# Patient Record
Sex: Male | Born: 1985 | Race: Black or African American | Hispanic: No | Smoking: Never smoker
Health system: Southern US, Community
[De-identification: ages and names within clinical notes are randomized; demographics above are authoritative.]

## PROBLEM LIST (undated history)

## (undated) DIAGNOSIS — E119 Type 2 diabetes mellitus without complications: Secondary | ICD-10-CM

## (undated) DIAGNOSIS — I10 Essential (primary) hypertension: Secondary | ICD-10-CM

## (undated) HISTORY — PX: INGUINAL HERNIA REPAIR: SUR1180

## (undated) HISTORY — PX: CHOLECYSTECTOMY: SHX55

## (undated) HISTORY — DX: Type 2 diabetes mellitus without complications: E11.9

---
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
## 2010-02-09 ENCOUNTER — Emergency Department (HOSPITAL_COMMUNITY): Admission: EM | Admit: 2010-02-09 | Discharge: 2010-02-09 | Payer: Self-pay | Admitting: Emergency Medicine

## 2011-07-02 ENCOUNTER — Emergency Department (HOSPITAL_COMMUNITY): Payer: Self-pay

## 2011-07-02 ENCOUNTER — Emergency Department (HOSPITAL_COMMUNITY)
Admission: EM | Admit: 2011-07-02 | Discharge: 2011-07-02 | Disposition: A | Discharge status: Home / Self Care | Payer: Self-pay | Attending: Emergency Medicine | Admitting: Emergency Medicine

## 2011-07-02 DIAGNOSIS — R109 Unspecified abdominal pain: Secondary | ICD-10-CM | POA: Insufficient documentation

## 2011-07-02 DIAGNOSIS — K8020 Calculus of gallbladder without cholecystitis without obstruction: Secondary | ICD-10-CM | POA: Insufficient documentation

## 2011-07-02 LAB — DIFFERENTIAL
Basophils Absolute: 0 10*3/uL (ref 0.0–0.1)
Basophils Relative: 0 % (ref 0–1)
Eosinophils Absolute: 0.1 10*3/uL (ref 0.0–0.7)
Eosinophils Relative: 1 % (ref 0–5)
Lymphocytes Relative: 9 % — ABNORMAL LOW (ref 12–46)
Lymphs Abs: 1.3 10*3/uL (ref 0.7–4.0)
Monocytes Absolute: 0.8 10*3/uL (ref 0.1–1.0)
Monocytes Relative: 5 % (ref 3–12)
Neutro Abs: 12.2 10*3/uL — ABNORMAL HIGH (ref 1.7–7.7)
Neutrophils Relative %: 85 % — ABNORMAL HIGH (ref 43–77)

## 2011-07-02 LAB — COMPREHENSIVE METABOLIC PANEL
ALT: 16 U/L (ref 0–53)
AST: 15 U/L (ref 0–37)
Albumin: 3.4 g/dL — ABNORMAL LOW (ref 3.5–5.2)
Alkaline Phosphatase: 69 U/L (ref 39–117)
BUN: 9 mg/dL (ref 6–23)
CO2: 30 mEq/L (ref 19–32)
Calcium: 9.1 mg/dL (ref 8.4–10.5)
Chloride: 101 mEq/L (ref 96–112)
Creatinine, Ser: 0.85 mg/dL (ref 0.50–1.35)
GFR calc Af Amer: >60 mL/min (ref >60)
GFR calc non Af Amer: >60 mL/min (ref >60)
Glucose, Bld: 100 mg/dL — ABNORMAL HIGH (ref 70–99)
Potassium: 3.7 mEq/L (ref 3.5–5.1)
Sodium: 138 mEq/L (ref 135–145)
Total Bilirubin: 0.5 mg/dL (ref 0.3–1.2)
Total Protein: 7.3 g/dL (ref 6.0–8.3)

## 2011-07-02 LAB — CBC
HCT: 43.4 % (ref 39.0–52.0)
Hemoglobin: 14.8 g/dL (ref 13.0–17.0)
MCH: 27 pg (ref 26.0–34.0)
MCHC: 34.1 g/dL (ref 30.0–36.0)
MCV: 79.1 fL (ref 78.0–100.0)
Platelets: 287 10*3/uL (ref 150–400)
RBC: 5.49 MIL/uL (ref 4.22–5.81)
RDW: 14.3 % (ref 11.5–15.5)
WBC: 14.5 10*3/uL — ABNORMAL HIGH (ref 4.0–10.5)

## 2011-07-02 LAB — OCCULT BLOOD X 1 CARD TO LAB, STOOL: Fecal Occult Bld: NEGATIVE

## 2011-07-02 LAB — LIPASE, BLOOD: Lipase: 23 U/L (ref 11–59)

## 2011-07-12 ENCOUNTER — Ambulatory Visit (INDEPENDENT_AMBULATORY_CARE_PROVIDER_SITE_OTHER): Payer: Self-pay | Admitting: General Surgery

## 2011-07-13 ENCOUNTER — Other Ambulatory Visit (HOSPITAL_COMMUNITY): Payer: Self-pay

## 2011-07-13 ENCOUNTER — Emergency Department (HOSPITAL_COMMUNITY): Admit: 2011-07-13 | Discharge: 2011-07-13 | Disposition: A | Discharge status: Home / Self Care | Payer: Self-pay

## 2011-07-13 ENCOUNTER — Emergency Department (HOSPITAL_COMMUNITY)
Admission: EM | Admit: 2011-07-13 | Discharge: 2011-07-13 | Disposition: A | Discharge status: Home / Self Care | Payer: Self-pay | Attending: Emergency Medicine | Admitting: Emergency Medicine

## 2011-07-13 DIAGNOSIS — R1011 Right upper quadrant pain: Secondary | ICD-10-CM | POA: Insufficient documentation

## 2011-07-13 DIAGNOSIS — K8020 Calculus of gallbladder without cholecystitis without obstruction: Secondary | ICD-10-CM | POA: Insufficient documentation

## 2011-07-13 LAB — CBC
HCT: 43.8 % (ref 39.0–52.0)
Hemoglobin: 14.9 g/dL (ref 13.0–17.0)
MCH: 27.1 pg (ref 26.0–34.0)
MCHC: 34 g/dL (ref 30.0–36.0)
MCV: 79.6 fL (ref 78.0–100.0)
Platelets: 298 10*3/uL (ref 150–400)
RBC: 5.5 MIL/uL (ref 4.22–5.81)
RDW: 14.7 % (ref 11.5–15.5)
WBC: 12.3 10*3/uL — ABNORMAL HIGH (ref 4.0–10.5)

## 2011-07-13 LAB — COMPREHENSIVE METABOLIC PANEL
ALT: 20 U/L (ref 0–53)
AST: 18 U/L (ref 0–37)
Albumin: 3.2 g/dL — ABNORMAL LOW (ref 3.5–5.2)
Alkaline Phosphatase: 75 U/L (ref 39–117)
BUN: 10 mg/dL (ref 6–23)
CO2: 29 mEq/L (ref 19–32)
Calcium: 9.1 mg/dL (ref 8.4–10.5)
Chloride: 102 mEq/L (ref 96–112)
Creatinine, Ser: 0.82 mg/dL (ref 0.50–1.35)
GFR calc Af Amer: >60 mL/min (ref >60)
GFR calc non Af Amer: >60 mL/min (ref >60)
Glucose, Bld: 104 mg/dL — ABNORMAL HIGH (ref 70–99)
Potassium: 3.9 mEq/L (ref 3.5–5.1)
Sodium: 138 mEq/L (ref 135–145)
Total Bilirubin: 0.2 mg/dL — ABNORMAL LOW (ref 0.3–1.2)
Total Protein: 7.1 g/dL (ref 6.0–8.3)

## 2011-07-13 LAB — DIFFERENTIAL
Basophils Absolute: 0.1 10*3/uL (ref 0.0–0.1)
Basophils Relative: 1 % (ref 0–1)
Eosinophils Absolute: 0.2 10*3/uL (ref 0.0–0.7)
Eosinophils Relative: 2 % (ref 0–5)
Lymphocytes Relative: 14 % (ref 12–46)
Lymphs Abs: 1.7 10*3/uL (ref 0.7–4.0)
Monocytes Absolute: 0.7 10*3/uL (ref 0.1–1.0)
Monocytes Relative: 6 % (ref 3–12)
Neutro Abs: 9.6 10*3/uL — ABNORMAL HIGH (ref 1.7–7.7)
Neutrophils Relative %: 78 % — ABNORMAL HIGH (ref 43–77)

## 2011-07-13 LAB — LIPASE, BLOOD: Lipase: 30 U/L (ref 11–59)

## 2011-08-08 ENCOUNTER — Ambulatory Visit (INDEPENDENT_AMBULATORY_CARE_PROVIDER_SITE_OTHER): Payer: Self-pay | Admitting: General Surgery

## 2011-08-14 ENCOUNTER — Encounter (INDEPENDENT_AMBULATORY_CARE_PROVIDER_SITE_OTHER): Payer: Self-pay | Admitting: General Surgery

## 2011-09-17 ENCOUNTER — Encounter: Payer: Self-pay | Admitting: *Deleted

## 2011-09-17 ENCOUNTER — Emergency Department (HOSPITAL_COMMUNITY): Payer: Self-pay

## 2011-09-17 ENCOUNTER — Inpatient Hospital Stay (HOSPITAL_COMMUNITY)
Admission: EM | Admit: 2011-09-17 | Discharge: 2011-09-19 | DRG: 419 | Disposition: A | Discharge status: Home / Self Care | Payer: Self-pay | Source: Ambulatory Visit | Attending: Surgery | Admitting: Surgery

## 2011-09-17 DIAGNOSIS — K8000 Calculus of gallbladder with acute cholecystitis without obstruction: Secondary | ICD-10-CM

## 2011-09-17 DIAGNOSIS — Z23 Encounter for immunization: Secondary | ICD-10-CM

## 2011-09-17 DIAGNOSIS — K8010 Calculus of gallbladder with chronic cholecystitis without obstruction: Secondary | ICD-10-CM | POA: Diagnosis present

## 2011-09-17 DIAGNOSIS — K8020 Calculus of gallbladder without cholecystitis without obstruction: Secondary | ICD-10-CM

## 2011-09-17 LAB — COMPREHENSIVE METABOLIC PANEL
ALT: 28 U/L (ref 0–53)
AST: 21 U/L (ref 0–37)
Albumin: 3.4 g/dL — ABNORMAL LOW (ref 3.5–5.2)
Alkaline Phosphatase: 63 U/L (ref 39–117)
BUN: 13 mg/dL (ref 6–23)
CO2: 30 mEq/L (ref 19–32)
Calcium: 9.1 mg/dL (ref 8.4–10.5)
Chloride: 103 mEq/L (ref 96–112)
Creatinine, Ser: 0.87 mg/dL (ref 0.50–1.35)
GFR calc Af Amer: >90 mL/min (ref >90)
GFR calc non Af Amer: >90 mL/min (ref >90)
Glucose, Bld: 117 mg/dL — ABNORMAL HIGH (ref 70–99)
Potassium: 3.7 mEq/L (ref 3.5–5.1)
Sodium: 141 mEq/L (ref 135–145)
Total Bilirubin: 0.3 mg/dL (ref 0.3–1.2)
Total Protein: 7.6 g/dL (ref 6.0–8.3)

## 2011-09-17 LAB — CBC
HCT: 46.4 % (ref 39.0–52.0)
HCT: 42.8 % (ref 39.0–52.0)
Hemoglobin: 15.5 g/dL (ref 13.0–17.0)
Hemoglobin: 14.8 g/dL (ref 13.0–17.0)
MCH: 26.7 pg (ref 26.0–34.0)
MCH: 27.4 pg (ref 26.0–34.0)
MCHC: 33.4 g/dL (ref 30.0–36.0)
MCHC: 34.6 g/dL (ref 30.0–36.0)
MCV: 79.9 fL (ref 78.0–100.0)
MCV: 79.3 fL (ref 78.0–100.0)
Platelets: 292 10*3/uL (ref 150–400)
Platelets: 263 10*3/uL (ref 150–400)
RBC: 5.81 MIL/uL (ref 4.22–5.81)
RBC: 5.4 MIL/uL (ref 4.22–5.81)
RDW: 14.4 % (ref 11.5–15.5)
RDW: 14.3 % (ref 11.5–15.5)
WBC: 14.6 10*3/uL — ABNORMAL HIGH (ref 4.0–10.5)
WBC: 14.8 10*3/uL — ABNORMAL HIGH (ref 4.0–10.5)

## 2011-09-17 LAB — DIFFERENTIAL
Basophils Absolute: 0 10*3/uL (ref 0.0–0.1)
Basophils Relative: 0 % (ref 0–1)
Eosinophils Absolute: 0 10*3/uL (ref 0.0–0.7)
Eosinophils Relative: 0 % (ref 0–5)
Lymphocytes Relative: 5 % — ABNORMAL LOW (ref 12–46)
Lymphs Abs: 0.8 10*3/uL (ref 0.7–4.0)
Monocytes Absolute: 0.4 10*3/uL (ref 0.1–1.0)
Monocytes Relative: 3 % (ref 3–12)
Neutro Abs: 13.4 10*3/uL — ABNORMAL HIGH (ref 1.7–7.7)
Neutrophils Relative %: 92 % — ABNORMAL HIGH (ref 43–77)

## 2011-09-17 LAB — CREATININE, SERUM
Creatinine, Ser: 0.88 mg/dL (ref 0.50–1.35)
GFR calc Af Amer: >90 mL/min (ref >90)
GFR calc non Af Amer: >90 mL/min (ref >90)

## 2011-09-17 LAB — LIPASE, BLOOD: Lipase: 21 U/L (ref 11–59)

## 2011-09-17 MED ORDER — KCL IN DEXTROSE-NACL 20-5-0.45 MEQ/L-%-% IV SOLN
INTRAVENOUS | Status: DC
  Administered 2011-09-18: 01:00:00 via INTRAVENOUS
  Filled 2011-09-17 (×4): qty 1000

## 2011-09-17 MED ORDER — HYDROMORPHONE HCL PF 1 MG/ML IJ SOLN
0.5000 mg | INTRAMUSCULAR | Status: DC | PRN
  Administered 2011-09-17 – 2011-09-18 (×7): 1 mg via INTRAVENOUS
  Administered 2011-09-18: 0.5 mg via INTRAVENOUS
  Filled 2011-09-17 (×6): qty 1

## 2011-09-17 MED ORDER — KCL IN DEXTROSE-NACL 20-5-0.45 MEQ/L-%-% IV SOLN
INTRAVENOUS | Status: AC
  Filled 2011-09-17: qty 1000

## 2011-09-17 MED ORDER — HYDROMORPHONE HCL PF 1 MG/ML IJ SOLN
1.0000 mg | Freq: Once | INTRAMUSCULAR | Status: AC
  Administered 2011-09-17: 1 mg via INTRAVENOUS
  Filled 2011-09-17: qty 1

## 2011-09-17 MED ORDER — ONDANSETRON HCL 4 MG/2ML IJ SOLN: 4.0000 mg | Freq: Four times a day (QID) | INTRAMUSCULAR | Status: DC | PRN

## 2011-09-17 MED ORDER — CIPROFLOXACIN IN D5W 400 MG/200ML IV SOLN
400.0000 mg | Freq: Two times a day (BID) | INTRAVENOUS | Status: DC
  Administered 2011-09-17 – 2011-09-19 (×5): 400 mg via INTRAVENOUS
  Filled 2011-09-17 (×5): qty 200

## 2011-09-17 MED ORDER — ONDANSETRON HCL 4 MG/2ML IJ SOLN
4.0000 mg | Freq: Once | INTRAMUSCULAR | Status: AC
  Administered 2011-09-17: 4 mg via INTRAVENOUS
  Filled 2011-09-17: qty 2

## 2011-09-17 MED ORDER — HYDROMORPHONE HCL PF 1 MG/ML IJ SOLN
INTRAMUSCULAR | Status: AC
  Administered 2011-09-17: 1 mg via INTRAVENOUS
  Filled 2011-09-17: qty 1

## 2011-09-17 MED ORDER — HEPARIN SODIUM (PORCINE) 5000 UNIT/ML IJ SOLN
5000.0000 [IU] | Freq: Three times a day (TID) | INTRAMUSCULAR | Status: DC
  Administered 2011-09-17 – 2011-09-19 (×3): 5000 [IU] via SUBCUTANEOUS
  Filled 2011-09-17 (×8): qty 1

## 2011-09-17 MED ORDER — ACETAMINOPHEN 650 MG RE SUPP: 650.0000 mg | Freq: Four times a day (QID) | RECTAL | Status: DC | PRN

## 2011-09-17 MED ORDER — ACETAMINOPHEN 325 MG PO TABS: 650.0000 mg | ORAL_TABLET | Freq: Four times a day (QID) | ORAL | Status: DC | PRN

## 2011-09-17 NOTE — H&P (Signed)
[name] is an 25 y.o. male.   Chief Complaint: Abdominal pain nausea and vomiting. HPI: This is a 25 year old African American male has been in good health until September of this year. Back in September he had an episode of abdominal pain nausea and vomiting he was seen in the emergency room and [facility]. He had a diagnosis of cholelithiasis and it was recommended he undergo cholecystectomy at that time. He he was referred to general surgery as an outpatient, but has not had insurance. He has been asymptomatic up until this morning around 7 AM. He awoke this with abdominal pain, nausea, and some mild diarrhea. He presented back to the emergency room at [facility] were abdominal ultrasound shows a 1.9 cm gallstone in the gallbladder neck. The stone does not change with position is small amount of sludge also noted. Gallbladder wall thicknesses 3 mm upper limits of normal the sonographic Murphy's sign was negative. Common bile duct measured 4.2 cm the remainder of the exam was normal. We were called for evaluation for cholecystitis and cholelithiasis. White count in the ER is 14,600. Liver function studies are all within normal limits.   History reviewed. No pertinent past medical history.  History reviewed. No pertinent past surgical history. history of right inguinal repair age 17  History reviewed. No pertinent family history. Social History:  reports that he has never smoked. He does not have any smokeless tobacco history on file. He reports that he does not drink alcohol or use illicit drugs.  Allergies: No Known Allergies  Medications Prior to Admission  Medication Dose Route Frequency Provider Last Rate Last Dose  . HYDROmorphone (DILAUDID) 1 MG/ML injection        1 mg at 09/17/11 1305  . HYDROmorphone (DILAUDID) injection 1 mg  1 mg Intravenous Once Gwyneth Sprout, MD   1 mg at 09/17/11 1137  . ondansetron (ZOFRAN) injection 4 mg  4 mg Intravenous Once Gwyneth Sprout, MD   4 mg at 09/17/11 1137   No current outpatient prescriptions on file as of 09/17/2011.    Results for orders placed during the hospital encounter of 09/17/11 (from the past 48 hour(s))  CBC     Status: Abnormal   Collection Time   09/17/11 11:19 AM      Component Value Range Comment   WBC 14.6 (*) 4.0 - 10.5 (K/uL)    RBC 5.81  4.22 - 5.81 (MIL/uL)    Hemoglobin 15.5  13.0 - 17.0 (g/dL)    HCT 14.7  82.9 - 56.2 (%)    MCV 79.9  78.0 - 100.0 (fL)    MCH 26.7  26.0 - 34.0 (pg)    MCHC 33.4  30.0 - 36.0 (g/dL)    RDW 13.0  86.5 - 78.4 (%)    Platelets 292  150 - 400 (K/uL)   DIFFERENTIAL     Status: Abnormal   Collection Time   09/17/11 11:19 AM      Component Value Range Comment   Neutrophils Relative 92 (*) 43 - 77 (%)    Neutro Abs 13.4 (*) 1.7 - 7.7 (K/uL)    Lymphocytes Relative 5 (*) 12 - 46 (%)    Lymphs Abs 0.8  0.7 - 4.0 (K/uL)    Monocytes Relative 3  3 - 12 (%)    Monocytes Absolute 0.4  0.1 - 1.0 (K/uL)    Eosinophils Relative 0  0 - 5 (%)    Eosinophils Absolute 0.0  0.0 - 0.7 (K/uL)    Basophils Relative 0  0 - 1 (%)    Basophils Absolute 0.0  0.0 - 0.1 (K/uL)   COMPREHENSIVE METABOLIC PANEL     Status: Abnormal   Collection Time   09/17/11 11:19 AM      Component Value Range Comment   Sodium 141  135 - 145 (mEq/L)    Potassium 3.7  3.5 - 5.1 (mEq/L)    Chloride 103  96 - 112 (mEq/L)    CO2 30  19 - 32 (mEq/L)    Glucose, Bld 117 (*) 70 - 99 (mg/dL)    BUN 13  6 - 23 (mg/dL)    Creatinine, Ser 4.09  0.50 - 1.35 (mg/dL)    Calcium 9.1  8.4 - 10.5 (mg/dL)    Total Protein 7.6  6.0 - 8.3 (g/dL)    Albumin 3.4 (*) 3.5 - 5.2 (g/dL)    AST 21  0 - 37 (U/L)    ALT 28  0 - 53 (U/L)    Alkaline Phosphatase 63  39 - 117 (U/L)    Total Bilirubin 0.3  0.3 - 1.2 (mg/dL)    GFR calc non Af Amer >90  >90 (mL/min)    GFR calc Af Amer >90  >90 (mL/min)   LIPASE, BLOOD     Status: Normal   Collection Time   09/17/11 11:19 AM      Component Value Range  Comment   Lipase 21  11 - 59 (U/L)    US Abdomen Complete  09/17/2011  *RADIOLOGY REPORT*  Clinical Data:  Right upper quadrant pain.  Evaluate for cholecystitis.  ABDOMINAL ULTRASOUND COMPLETE  Comparison:  Abdominal ultrasound 07/13/2011 and 07/02/2011  Findings:  Gallbladder:  There is a 1.9 cm gallstone within the gallbladder neck.  This stone does not change position with changes in patient positioning.  Small amount of gallbladder sludge is also noted. Gallbladder wall thickness is upper normal at 3.0 mm.  The sonographic Murphy's sign is negative.  Common Bile Duct:  Measures 4.2 mm, within normal limits in caliber.  Liver: No focal mass lesion identified.  Within normal limits in parenchymal echogenicity.  IVC:  Appears normal.  Pancreas:  No abnormality identified.  Spleen:  Within normal limits in size and echotexture.  Right kidney:  Normal in size and parenchymal echogenicity.  No evidence of mass or hydronephrosis.  Left kidney:  Normal in size and parenchymal echogenicity.  No evidence of mass or hydronephrosis.  Abdominal Aorta:  No aneurysm identified.  IMPRESSION: 1. Cholelithiasis. A 1.9 cm gallstone within the gallbladder neck appears immobile.  This has similar appearances compared to the prior ultrasounds of September 2012.  No definite evidence of acute cholecystitis (gallbladder wall is upper limits of normal and the sonographic Murphy's sign is negative). 2.  Remainder of the abdominal ultrasound is within normal limits.  Original Report Authenticated By: Britta Mccreedy, M.D.    Review of Systems  Constitutional: Negative for fever and chills. Weight loss: since Thanksgiving.  HENT: Negative.   Eyes: Negative.   Respiratory: Negative.   Cardiovascular: Negative.   Gastrointestinal: Positive for nausea (this a.m.), vomiting and abdominal pain. Diarrhea: this a.m. new problem.  Genitourinary: Negative.   Musculoskeletal: Negative.   Skin: Negative.        Multiple tatoos    Neurological: Negative.   Endo/Heme/Allergies: Negative.   Psychiatric/Behavioral: Negative.     Blood pressure 128/80, pulse 82, temperature 98.3 F (36.8 C), temperature  source Oral, resp. rate 20, SpO2 100.00%. Physical Exam  Constitutional: He is oriented to person, place, and time. He appears well-developed and well-nourished.  HENT:  Head: Normocephalic and atraumatic.  Eyes: Conjunctivae and EOM are normal. Pupils are equal, round, and reactive to light.  Neck: Normal range of motion. Neck supple. No JVD present. No tracheal deviation present. No thyromegaly present.  Cardiovascular: Normal rate, regular rhythm, normal heart sounds and intact distal pulses.   Respiratory: Effort normal and breath sounds normal.  GI: Soft. Bowel sounds are normal. There is tenderness (slightly, RUQ).  Musculoskeletal: Normal range of motion.  Lymphadenopathy:    He has no cervical adenopathy.  Neurological: He is alert and oriented to person, place, and time.  Skin: Skin is warm and dry.       Multiple tatoos  Psychiatric: He has a normal mood and affect. His behavior is normal. Judgment and thought content normal.     Assessment/Plan Cholelithiasis with probable cholecystitis. WBC 14.6 Plan: We'll admit the patient place him on IV antibiotics and some clear liquid for tonight. Make him n.p.o. and tentatively schedule for cholecystectomy tomorrow.  09/17/2011, 2:52 PM

## 2011-09-17 NOTE — ED Notes (Signed)
Rt abd pain since 0730 this am states that he has vomitied a lot denies dysuria

## 2011-09-17 NOTE — ED Notes (Signed)
Beverage provided for the patient per nurse/tracey.  Clear beverage.

## 2011-09-17 NOTE — ED Notes (Signed)
Reports being diagnosed with gallstones in past. Having right side abd pain and n/v since this am. No acute distress noted at triage.

## 2011-09-17 NOTE — ED Provider Notes (Signed)
History     CSN: [id number] Arrival date & time: 09/17/2011 10:03 AM   First MD Initiated Contact with Patient 09/17/11 1039      Chief Complaint  Patient presents with  . Abdominal Pain  . Emesis    (Consider location/radiation/quality/duration/timing/severity/associated sxs/prior treatment) Patient is a 25 y.o. male presenting with abdominal pain and vomiting. The history is provided by the patient.  Abdominal Pain The primary symptoms of the illness include abdominal pain, nausea and vomiting. The primary symptoms of the illness do not include fever, shortness of breath or diarrhea. The current episode started 3 to 5 hours ago. The onset of the illness was sudden. The problem has not changed since onset. The abdominal pain is located in the RUQ. The abdominal pain does not radiate. The severity of the abdominal pain is 10/10. The abdominal pain is relieved by nothing. The abdominal pain is exacerbated by vomiting and certain positions.  Vomiting occurs more than 10 times per day. The emesis contains stomach contents.  The patient has not had a change in bowel habit. Additional symptoms associated with the illness include anorexia. Symptoms associated with the illness do not include constipation. Significant associated medical issues include gallstones.  Emesis  Associated symptoms include abdominal pain. Pertinent negatives include no diarrhea and no fever.    History reviewed. No pertinent past medical history.  History reviewed. No pertinent past surgical history.  History reviewed. No pertinent family history.  History  Substance Use Topics  . Smoking status: Never Smoker   . Smokeless tobacco: Not on file  . Alcohol Use: No      Review of Systems  Constitutional: Negative for fever.  Respiratory: Negative for shortness of breath.   Gastrointestinal: Positive for nausea, vomiting, abdominal pain and anorexia. Negative for diarrhea and constipation.  All other  systems reviewed and are negative.    Allergies  Review of patient's allergies indicates no known allergies.  Home Medications  No current outpatient prescriptions on file.  BP 132/81  Pulse 62  Temp(Src) 98.2 F (36.8 C) (Oral)  Resp 18  SpO2 96%  Physical Exam  Nursing note and vitals reviewed. Constitutional: He is oriented to person, place, and time. He appears well-developed and well-nourished. No distress.  HENT:  Head: Normocephalic and atraumatic.  Mouth/Throat: Oropharynx is clear and moist. Mucous membranes are dry.  Eyes: Conjunctivae and EOM are normal. Pupils are equal, round, and reactive to light.  Neck: Normal range of motion. Neck supple.  Cardiovascular: Normal rate, regular rhythm and intact distal pulses.   No murmur heard. Pulmonary/Chest: Effort normal and breath sounds normal. No respiratory distress. He has no wheezes. He has no rales.  Abdominal: Soft. Normal appearance and bowel sounds are normal. He exhibits no distension. There is tenderness in the right upper quadrant. There is guarding and positive Murphy's sign. There is no rebound.  Musculoskeletal: Normal range of motion. He exhibits no edema and no tenderness.  Neurological: He is alert and oriented to person, place, and time.  Skin: Skin is warm and dry. No rash noted. No erythema.  Psychiatric: He has a normal mood and affect. His behavior is normal.    ED Course  Procedures (including critical care time)  Results for orders placed during the hospital encounter of 09/17/11  CBC      Component Value Range   WBC 14.6 (*) 4.0 - 10.5 (K/uL)   RBC 5.81  4.22 - 5.81 (MIL/uL)   Hemoglobin 15.5  13.0 -  17.0 (g/dL)   HCT 16.1  09.6 - 04.5 (%)   MCV 79.9  78.0 - 100.0 (fL)   MCH 26.7  26.0 - 34.0 (pg)   MCHC 33.4  30.0 - 36.0 (g/dL)   RDW 40.9  81.1 - 91.4 (%)   Platelets 292  150 - 400 (K/uL)  DIFFERENTIAL      Component Value Range   Neutrophils Relative 92 (*) 43 - 77 (%)   Neutro Abs  13.4 (*) 1.7 - 7.7 (K/uL)   Lymphocytes Relative 5 (*) 12 - 46 (%)   Lymphs Abs 0.8  0.7 - 4.0 (K/uL)   Monocytes Relative 3  3 - 12 (%)   Monocytes Absolute 0.4  0.1 - 1.0 (K/uL)   Eosinophils Relative 0  0 - 5 (%)   Eosinophils Absolute 0.0  0.0 - 0.7 (K/uL)   Basophils Relative 0  0 - 1 (%)   Basophils Absolute 0.0  0.0 - 0.1 (K/uL)  COMPREHENSIVE METABOLIC PANEL      Component Value Range   Sodium 141  135 - 145 (mEq/L)   Potassium 3.7  3.5 - 5.1 (mEq/L)   Chloride 103  96 - 112 (mEq/L)   CO2 30  19 - 32 (mEq/L)   Glucose, Bld 117 (*) 70 - 99 (mg/dL)   BUN 13  6 - 23 (mg/dL)   Creatinine, Ser 7.82  0.50 - 1.35 (mg/dL)   Calcium 9.1  8.4 - 95.6 (mg/dL)   Total Protein 7.6  6.0 - 8.3 (g/dL)   Albumin 3.4 (*) 3.5 - 5.2 (g/dL)   AST 21  0 - 37 (U/L)   ALT 28  0 - 53 (U/L)   Alkaline Phosphatase 63  39 - 117 (U/L)   Total Bilirubin 0.3  0.3 - 1.2 (mg/dL)   GFR calc non Af Amer >90  >90 (mL/min)   GFR calc Af Amer >90  >90 (mL/min)  LIPASE, BLOOD      Component Value Range   Lipase 21  11 - 59 (U/L)   US Abdomen Complete  09/17/2011  *RADIOLOGY REPORT*  Clinical Data:  Right upper quadrant pain.  Evaluate for cholecystitis.  ABDOMINAL ULTRASOUND COMPLETE  Comparison:  Abdominal ultrasound 07/13/2011 and 07/02/2011  Findings:  Gallbladder:  There is a 1.9 cm gallstone within the gallbladder neck.  This stone does not change position with changes in patient positioning.  Small amount of gallbladder sludge is also noted. Gallbladder wall thickness is upper normal at 3.0 mm.  The sonographic Murphy's sign is negative.  Common Bile Duct:  Measures 4.2 mm, within normal limits in caliber.  Liver: No focal mass lesion identified.  Within normal limits in parenchymal echogenicity.  IVC:  Appears normal.  Pancreas:  No abnormality identified.  Spleen:  Within normal limits in size and echotexture.  Right kidney:  Normal in size and parenchymal echogenicity.  No evidence of mass or  hydronephrosis.  Left kidney:  Normal in size and parenchymal echogenicity.  No evidence of mass or hydronephrosis.  Abdominal Aorta:  No aneurysm identified.  IMPRESSION: 1. Cholelithiasis. A 1.9 cm gallstone within the gallbladder neck appears immobile.  This has similar appearances compared to the prior ultrasounds of September 2012.  No definite evidence of acute cholecystitis (gallbladder wall is upper limits of normal and the sonographic Murphy's sign is negative). 2.  Remainder of the abdominal ultrasound is within normal limits.  Original Report Authenticated By: Britta Mccreedy, M.D.      No  diagnosis found.    MDM   Pt with abd pain consistent with gallstones with hx of gallstones in the past dx about 3 weeks ago but does not have insurance and has not made appt for removal.  Pt is vomiting and RUQ pain when he woke up this am but has not eaten.  No other medical hx and no other sx. Will check CBC, CMP, lipase and [place] to further eval for cholecystitis.   1:25 PM Pt with leukocytosis of 14,000 and CMP/lipase wnl.  U/S with 1.60mm stone at the neck of the gallbladder and wall thickness at the upper limits of normal.  However on repeat eval still having persistent pain. Will discuss with surgery.     Gwyneth Sprout, MD 09/17/11 1326

## 2011-09-18 ENCOUNTER — Inpatient Hospital Stay (HOSPITAL_COMMUNITY): Payer: Self-pay

## 2011-09-18 ENCOUNTER — Encounter (HOSPITAL_COMMUNITY): Payer: Self-pay

## 2011-09-18 ENCOUNTER — Encounter (HOSPITAL_COMMUNITY): Payer: Self-pay | Admitting: Anesthesiology

## 2011-09-18 ENCOUNTER — Inpatient Hospital Stay (HOSPITAL_COMMUNITY): Payer: Self-pay | Admitting: Anesthesiology

## 2011-09-18 ENCOUNTER — Encounter (HOSPITAL_COMMUNITY): Admission: EM | Disposition: A | Discharge status: Home / Self Care | Payer: Self-pay | Source: Ambulatory Visit

## 2011-09-18 ENCOUNTER — Other Ambulatory Visit (INDEPENDENT_AMBULATORY_CARE_PROVIDER_SITE_OTHER): Payer: Self-pay | Admitting: Surgery

## 2011-09-18 DIAGNOSIS — K8000 Calculus of gallbladder with acute cholecystitis without obstruction: Secondary | ICD-10-CM

## 2011-09-18 HISTORY — PX: CHOLECYSTECTOMY: SHX55

## 2011-09-18 LAB — CBC
HCT: 42.7 % (ref 39.0–52.0)
Hemoglobin: 14.4 g/dL (ref 13.0–17.0)
MCH: 26.8 pg (ref 26.0–34.0)
MCHC: 33.7 g/dL (ref 30.0–36.0)
MCV: 79.4 fL (ref 78.0–100.0)
Platelets: 260 10*3/uL (ref 150–400)
RBC: 5.38 MIL/uL (ref 4.22–5.81)
RDW: 14.3 % (ref 11.5–15.5)
WBC: 13.7 10*3/uL — ABNORMAL HIGH (ref 4.0–10.5)

## 2011-09-18 LAB — COMPREHENSIVE METABOLIC PANEL
ALT: 65 U/L — ABNORMAL HIGH (ref 0–53)
AST: 51 U/L — ABNORMAL HIGH (ref 0–37)
Albumin: 3 g/dL — ABNORMAL LOW (ref 3.5–5.2)
Alkaline Phosphatase: 64 U/L (ref 39–117)
BUN: 10 mg/dL (ref 6–23)
CO2: 28 mEq/L (ref 19–32)
Calcium: 8.5 mg/dL (ref 8.4–10.5)
Chloride: 103 mEq/L (ref 96–112)
Creatinine, Ser: 0.94 mg/dL (ref 0.50–1.35)
GFR calc Af Amer: >90 mL/min (ref >90)
GFR calc non Af Amer: >90 mL/min (ref >90)
Glucose, Bld: 102 mg/dL — ABNORMAL HIGH (ref 70–99)
Potassium: 3.8 mEq/L (ref 3.5–5.1)
Sodium: 138 mEq/L (ref 135–145)
Total Bilirubin: 0.8 mg/dL (ref 0.3–1.2)
Total Protein: 6.8 g/dL (ref 6.0–8.3)

## 2011-09-18 LAB — LIPASE, BLOOD: Lipase: 23 U/L (ref 11–59)

## 2011-09-18 LAB — SURGICAL PCR SCREEN
MRSA, PCR: NEGATIVE
Staphylococcus aureus: NEGATIVE

## 2011-09-18 SURGERY — LAPAROSCOPIC CHOLECYSTECTOMY WITH INTRAOPERATIVE CHOLANGIOGRAM
Anesthesia: General | Site: Abdomen | Wound class: Contaminated

## 2011-09-18 MED ORDER — SODIUM CHLORIDE 0.9 % IR SOLN
Status: DC | PRN
  Administered 2011-09-18: 1000 mL

## 2011-09-18 MED ORDER — HYDROCODONE-ACETAMINOPHEN 5-325 MG PO TABS
1.0000 | ORAL_TABLET | ORAL | Status: DC | PRN
  Administered 2011-09-18 – 2011-09-19 (×3): 2 via ORAL
  Filled 2011-09-18 (×3): qty 2

## 2011-09-18 MED ORDER — BUPIVACAINE HCL (PF) 0.25 % IJ SOLN
INTRAMUSCULAR | Status: DC | PRN
  Administered 2011-09-18: 30 mL

## 2011-09-18 MED ORDER — INFLUENZA VIRUS VACC SPLIT PF IM SUSP
0.5000 mL | INTRAMUSCULAR | Status: AC
  Administered 2011-09-19: 0.5 mL via INTRAMUSCULAR
  Filled 2011-09-18: qty 0.5

## 2011-09-18 MED ORDER — FENTANYL CITRATE 0.05 MG/ML IJ SOLN
INTRAMUSCULAR | Status: DC | PRN
  Administered 2011-09-18 (×2): 50 ug via INTRAVENOUS
  Administered 2011-09-18: 150 ug via INTRAVENOUS

## 2011-09-18 MED ORDER — IOHEXOL 300 MG/ML  SOLN
INTRAMUSCULAR | Status: DC | PRN
  Administered 2011-09-18: 13 mL

## 2011-09-18 MED ORDER — BIOTENE DRY MOUTH MT LIQD: 15.0000 mL | Freq: Two times a day (BID) | OROMUCOSAL | Status: DC

## 2011-09-18 MED ORDER — KCL IN DEXTROSE-NACL 20-5-0.45 MEQ/L-%-% IV SOLN
INTRAVENOUS | Status: DC
  Administered 2011-09-18 – 2011-09-19 (×2): via INTRAVENOUS
  Filled 2011-09-18 (×5): qty 1000

## 2011-09-18 MED ORDER — ONDANSETRON HCL 4 MG/2ML IJ SOLN: 4.0000 mg | Freq: Four times a day (QID) | INTRAMUSCULAR | Status: DC | PRN

## 2011-09-18 MED ORDER — MORPHINE SULFATE 2 MG/ML IJ SOLN
1.0000 mg | INTRAMUSCULAR | Status: DC | PRN
  Administered 2011-09-18 (×2): 2 mg via INTRAVENOUS
  Filled 2011-09-18: qty 1

## 2011-09-18 MED ORDER — HEPARIN SODIUM (PORCINE) 5000 UNIT/ML IJ SOLN
5000.0000 [IU] | Freq: Three times a day (TID) | INTRAMUSCULAR | Status: DC
  Filled 2011-09-18: qty 1

## 2011-09-18 MED ORDER — ROCURONIUM BROMIDE 100 MG/10ML IV SOLN
INTRAVENOUS | Status: DC | PRN
  Administered 2011-09-18: 80 mg via INTRAVENOUS

## 2011-09-18 MED ORDER — ONDANSETRON HCL 4 MG PO TABS: 4.0000 mg | ORAL_TABLET | Freq: Four times a day (QID) | ORAL | Status: DC | PRN

## 2011-09-18 MED ORDER — CHLORHEXIDINE GLUCONATE 0.12 % MT SOLN
15.0000 mL | Freq: Two times a day (BID) | OROMUCOSAL | Status: DC
  Administered 2011-09-18 (×2): 15 mL via OROMUCOSAL
  Filled 2011-09-18 (×2): qty 15

## 2011-09-18 MED ORDER — MORPHINE SULFATE 2 MG/ML IJ SOLN
INTRAMUSCULAR | Status: AC
  Administered 2011-09-18: 2 mg via INTRAVENOUS
  Filled 2011-09-18: qty 1

## 2011-09-18 MED ORDER — PROPOFOL 10 MG/ML IV EMUL
INTRAVENOUS | Status: DC | PRN
  Administered 2011-09-18: 200 mg via INTRAVENOUS

## 2011-09-18 MED ORDER — ONDANSETRON HCL 4 MG/2ML IJ SOLN
INTRAMUSCULAR | Status: DC | PRN
  Administered 2011-09-18: 4 mg via INTRAVENOUS

## 2011-09-18 MED ORDER — LIDOCAINE HCL (CARDIAC) 20 MG/ML IV SOLN
INTRAVENOUS | Status: DC | PRN
  Administered 2011-09-18: 100 mg via INTRAVENOUS

## 2011-09-18 MED ORDER — LACTATED RINGERS IV SOLN
INTRAVENOUS | Status: DC | PRN
  Administered 2011-09-18 (×2): via INTRAVENOUS

## 2011-09-18 SURGICAL SUPPLY — 59 items
ADH SKN CLS APL DERMABOND .7 (GAUZE/BANDAGES/DRESSINGS) ×1
ADH SKN CLS LQ APL DERMABOND (GAUZE/BANDAGES/DRESSINGS) ×1
APPLIER CLIP ROT 10 11.4 M/L (STAPLE) ×2
APR CLP MED LRG 11.4X10 (STAPLE) ×1
BAG SPEC RTRVL LRG 6X4 10 (ENDOMECHANICALS) ×1
BLADE SURG ROTATE 9660 (MISCELLANEOUS) IMPLANT
CANISTER SUCTION 2500CC (MISCELLANEOUS) ×2 IMPLANT
CHLORAPREP W/TINT 10.5 ML (MISCELLANEOUS) ×1 IMPLANT
CHLORAPREP W/TINT 26ML (MISCELLANEOUS) ×2 IMPLANT
CHOLANGIOGRAM CATH TAUT (CATHETERS) ×2 IMPLANT
CLIP APPLIE ROT 10 11.4 M/L (STAPLE) ×1 IMPLANT
CLOTH BEACON ORANGE TIMEOUT ST (SAFETY) ×2 IMPLANT
COVER MAYO STAND STRL (DRAPES) ×2 IMPLANT
COVER SURGICAL LIGHT HANDLE (MISCELLANEOUS) ×2 IMPLANT
DECANTER SPIKE VIAL GLASS SM (MISCELLANEOUS) ×3 IMPLANT
DERMABOND ADHESIVE PROPEN (GAUZE/BANDAGES/DRESSINGS) ×1
DERMABOND ADVANCED (GAUZE/BANDAGES/DRESSINGS) ×1
DERMABOND ADVANCED .7 DNX12 (GAUZE/BANDAGES/DRESSINGS) ×1 IMPLANT
DERMABOND ADVANCED .7 DNX6 (GAUZE/BANDAGES/DRESSINGS) IMPLANT
DRAPE C-ARM 42X72 X-RAY (DRAPES) ×2 IMPLANT
ELECT REM PT RETURN 9FT ADLT (ELECTROSURGICAL) ×2
ELECTRODE REM PT RTRN 9FT ADLT (ELECTROSURGICAL) ×1 IMPLANT
FILTER SMOKE EVAC LAPAROSHD (FILTER) ×2 IMPLANT
GAUZE SPONGE 2X2 8PLY STRL LF (GAUZE/BANDAGES/DRESSINGS) ×1 IMPLANT
GLOVE BIO SURGEON STRL SZ8 (GLOVE) ×1 IMPLANT
GLOVE BIOGEL PI IND STRL 7.0 (GLOVE) IMPLANT
GLOVE BIOGEL PI IND STRL 8 (GLOVE) IMPLANT
GLOVE BIOGEL PI INDICATOR 7.0 (GLOVE) ×3
GLOVE BIOGEL PI INDICATOR 8 (GLOVE) ×1
GLOVE ECLIPSE 6.5 STRL STRAW (GLOVE) ×4 IMPLANT
GLOVE SS BIOGEL STRL SZ 7 (GLOVE) IMPLANT
GLOVE SUPERSENSE BIOGEL SZ 7 (GLOVE) ×1
GLOVE SURG SIGNA 7.5 PF LTX (GLOVE) ×2 IMPLANT
GLOVE SURG SS PI 6.5 STRL IVOR (GLOVE) ×1 IMPLANT
GLOVE SURG SS PI 7.5 STRL IVOR (GLOVE) ×2 IMPLANT
GOWN PREVENTION PLUS XLARGE (GOWN DISPOSABLE) ×2 IMPLANT
GOWN STRL NON-REIN LRG LVL3 (GOWN DISPOSABLE) ×4 IMPLANT
GOWN STRL REIN XL XLG (GOWN DISPOSABLE) ×2 IMPLANT
IV CATH 14GX2 1/4 (CATHETERS) ×2 IMPLANT
KIT BASIN OR (CUSTOM PROCEDURE TRAY) ×2 IMPLANT
KIT ROOM TURNOVER OR (KITS) ×2 IMPLANT
NS IRRIG 1000ML POUR BTL (IV SOLUTION) ×2 IMPLANT
PAD ARMBOARD 7.5X6 YLW CONV (MISCELLANEOUS) ×2 IMPLANT
POUCH SPECIMEN RETRIEVAL 10MM (ENDOMECHANICALS) ×2 IMPLANT
SCISSORS LAP 5X35 DISP (ENDOMECHANICALS) IMPLANT
SET IRRIG TUBING LAPAROSCOPIC (IRRIGATION / IRRIGATOR) ×3 IMPLANT
SLEEVE Z-THREAD 5X100MM (TROCAR) ×2 IMPLANT
SPECIMEN JAR SMALL (MISCELLANEOUS) ×2 IMPLANT
SPONGE GAUZE 2X2 STER 10/PKG (GAUZE/BANDAGES/DRESSINGS)
STOPCOCK 4 WAY LG BORE MALE ST (IV SETS) ×2 IMPLANT
SUT VIC AB 5-0 PS2 18 (SUTURE) ×2 IMPLANT
TOWEL OR 17X24 6PK STRL BLUE (TOWEL DISPOSABLE) ×2 IMPLANT
TOWEL OR 17X26 10 PK STRL BLUE (TOWEL DISPOSABLE) ×2 IMPLANT
TRAY LAPAROSCOPIC (CUSTOM PROCEDURE TRAY) ×2 IMPLANT
TROCAR XCEL BLUNT TIP 100MML (ENDOMECHANICALS) ×2 IMPLANT
TROCAR Z-THREAD FIOS 11X100 BL (TROCAR) ×2 IMPLANT
TROCAR Z-THREAD FIOS 5X100MM (TROCAR) ×2 IMPLANT
TUBING EXTENTION W/L.L. (IV SETS) ×2 IMPLANT
WATER STERILE IRR 1000ML POUR (IV SOLUTION) IMPLANT

## 2011-09-18 NOTE — Op Note (Signed)
[name], [name]                ACCOUNT NO.:  [id number]  MEDICAL RECORD NO.:  [id number]  LOCATION:  5148                         FACILITY:  [facility]  PHYSICIAN:  Sandria Bales. Ezzard Standing, M.D.  DATE OF BIRTH:  06-Sep-1986  DATE OF PROCEDURE: DATE OF DISCHARGE:                              OPERATIVE REPORT   This is an unedited report.  For final edited dictation, please contact [facility]/Liz Katrinka Blazing.  PREOPERATIVE DIAGNOSIS:  Acute cholecystitis, cholelithiasis.  POSTOPERATIVE DIAGNOSIS:  Edematous cholecystitis, cholelithiasis.  PROCEDURES:  Laparoscopic cholecystectomy with intraoperative cholangiogram.  SURGEON:  Sandria Bales. Ezzard Standing, MD  FIRST ASSISTANT:  Lodema Pilot, MD  ANESTHESIA:  General endotracheal.  ESTIMATED BLOOD LOSS:  Minimal.  INDICATION FOR PROCEDURE:  Mr. [name] is a 25 year old African American male who presented to the [facility] Emergency Room with acute cholecystitis.  I discussed with him about proceeding with a laparoscopic cholecystectomy.  I discussed the indications, potential complications of surgery.  The potential complications include, but are not limited to, bleeding, infection, common bile duct injury, and open gallbladder surgery.  OPERATIVE NOTE:  The patient was placed in a supine position, given a general endotracheal anesthetic, was supervised by Dr. Adonis Huguenin, in room #17.  Time-out was held and surgical checklist run.  Abdomen was prepped with ChloraPrep and sterilely draped.  An infraumbilical incision with sharp dissection carried down to the abdominal cavity.  A 0-degree, 10-mm laparoscope was inserted through a 12-mm Hasson trocar and Hasson trocar was secured with a 0 Vicryl suture.  I placed a 10-mm trocar in the subxiphoid location.  I placed a 5-mm trocar in the right mid subcostal and a 5-mm trocar in the right lateral subcostal position.  I switched the scope to an angled scope.  Abdominal exploration revealed right  and left lobes of liver were unremarkable.  The stomach was unremarkable.  The bowel that I could see was unremarkable.  I then turned my attention to the gallbladder, was acutely edematous and distended.  It appeared he had a large stone about 1.5-2 cm impacted in the neck of the gallbladder.  I decompressed the gallbladder.  I then dissected out the cystic duct and cystic artery.  I placed 3 clips across the cystic artery.  I placed a clip on the gallbladder side of the cystic duct and shot intraoperative cholangiogram.  The intraoperative cholangiogram was shot using a cutoff Taut.  Taut catheter was inserted through a 16-gauge Jelco and placed at the side of the cut cystic duct.  This was secured with an Endoclip.  Under fluoroscopy, I injected about 12 mL of half-strength Hypaque solution that showed free flow of contrast down the cystic duct into the common bile duct up the hepatic radicles into the duodenum.  There was no filling defect or mass.  This was felt to be a normal intraoperative cholangiogram.  The Taut catheter was then removed.  The cystic duct triply endoclipped and divided.  The posterior branch of the cystic artery was identified, clipped and divided.  The gallbladder was then sharply and bluntly dissected from the gallbladder bed.  As the gallbladder bed got up towards the dome of the  gallbladder, were about 2/3rd of the gallbladder, was actually intrahepatic and had to kind of get this out.  The gallbladder was then removed, placed in EndoCatch bag.  I revisualized the triangle of Calot.  I revisualized the gallbladder bed, saw no bleeding or bile leak.  The gallbladder was then removed through the umbilicus, sent to Pathology for permanent pathology.  The sponge and needle count were correct.  The ports were removed in turn.  The umbilical port was closed with 0 Vicryl suture.  The skin at each port was closed with 5-0 Monocryl suture, painted with  Dermabond and sterilely dressed.  The patient was then transferred to the recovery room in good condition.  Sponge and needle counts were correct at the end of the case.     Sandria Bales. Ezzard Standing, M.D.     DHN/MEDQ  D:  09/18/2011  T:  09/18/2011  Job:  [id number]

## 2011-09-18 NOTE — Progress Notes (Signed)
HD #1 No PCP  Assessment/Plan:   1. Cholecystitis with cholelithiasis (09/17/2011)  Probable impacted gall stone.  I discussed with the patient the indications and risks of gall bladder surgery.  The primary risks of gall bladder surgery include, but are not limited to, bleeding, infection, common bile duct injury, and open surgery.  There is also the risk that the patient may have continued symptoms after surgery. I tried to answer the patient's questions.    LOS: 1 day   Subjective:  Still sore.  His brother had gall bladder disease (but he is a sickle cell pt.)  Objective:   Filed Vitals:   09/18/11 0520  BP: 126/66  Pulse: 83  Temp: 98.9 F (37.2 C)  Resp: 16     Intake/Output from previous day:  11/26 0701 - 11/27 0700 In: 800 [I.V.:800] Out: 750 [Urine:750]  Intake/Output this shift:      Physical Exam:   General: Large AA male who is alert and generally healthy appearing.    HEENT: Normal. Pupils equal. Good dentition. .   Lungs: Clear    Abdomen: Mild RUQ soreness   Neurologic:  Grossly intact to motor and sensory function.   Psychiatric: Has normal mood and affect. Behavior is normal   Lab Results:    Basename 09/18/11 0505 09/17/11 2257  WBC 13.7* 14.8*  HGB 14.4 14.8  HCT 42.7 42.8  PLT 260 263    BMET   Basename 09/18/11 0505 09/17/11 2257 09/17/11 1119  NA 138 -- 141  K 3.8 -- 3.7  CL 103 -- 103  CO2 28 -- 30  GLUCOSE 102* -- 117*  BUN 10 -- 13  CREATININE 0.94 0.88 --  CALCIUM 8.5 -- 9.1    PT/INR  No results found for this basename: LABPROT:2,INR:2 in the last 72 hours  ABG  No results found for this basename: PHART:2,PCO2:2,PO2:2,HCO3:2 in the last 72 hours   Studies/Results:  US Abdomen Complete  09/17/2011  *RADIOLOGY REPORT*  Clinical Data:  Right upper quadrant pain.  Evaluate for cholecystitis.  ABDOMINAL ULTRASOUND COMPLETE  Comparison:  Abdominal ultrasound 07/13/2011 and 07/02/2011  Findings:  Gallbladder:  There is a 1.9 cm  gallstone within the gallbladder neck.  This stone does not change position with changes in patient positioning.  Small amount of gallbladder sludge is also noted. Gallbladder wall thickness is upper normal at 3.0 mm.  The sonographic Murphy's sign is negative.  Common Bile Duct:  Measures 4.2 mm, within normal limits in caliber.  Liver: No focal mass lesion identified.  Within normal limits in parenchymal echogenicity.  IVC:  Appears normal.  Pancreas:  No abnormality identified.  Spleen:  Within normal limits in size and echotexture.  Right kidney:  Normal in size and parenchymal echogenicity.  No evidence of mass or hydronephrosis.  Left kidney:  Normal in size and parenchymal echogenicity.  No evidence of mass or hydronephrosis.  Abdominal Aorta:  No aneurysm identified.  IMPRESSION: 1. Cholelithiasis. A 1.9 cm gallstone within the gallbladder neck appears immobile.  This has similar appearances compared to the prior ultrasounds of September 2012.  No definite evidence of acute cholecystitis (gallbladder wall is upper limits of normal and the sonographic Murphy's sign is negative). 2.  Remainder of the abdominal ultrasound is within normal limits.  Original Report Authenticated By: Britta Mccreedy, M.D.     Anti-infectives:   Anti-infectives     Start     Dose/Rate Route Frequency Ordered Stop   09/17/11 1730  ciprofloxacin (CIPRO) IVPB 400 mg        400 mg 200 mL/hr over 60 Minutes Intravenous Every 12 hours 09/17/11 1715             Ovidio Kin, MD, FACS Pager: [phone],   [facility] Surgery Office: [phone] 09/18/2011

## 2011-09-18 NOTE — Progress Notes (Signed)
Patient has been npo since midnight, Yvone Neu, RN

## 2011-09-18 NOTE — Preoperative (Signed)
Beta Blockers   Reason not to administer Beta Blockers:Not Applicable 

## 2011-09-18 NOTE — Anesthesia Postprocedure Evaluation (Signed)
Anesthesia Post Note  Patient: [name]  Procedure(s) Performed:  LAPAROSCOPIC CHOLECYSTECTOMY WITH INTRAOPERATIVE CHOLANGIOGRAM  Anesthesia type: General  Patient location: PACU  Post pain: Pain level controlled  Post assessment: Patient's Cardiovascular Status Stable  Last Vitals:  Filed Vitals:   09/18/11 1315  BP: 134/74  Pulse: 96  Temp: 37 C  Resp: 18    Post vital signs: Reviewed and stable  Level of consciousness: sedated  Complications: No apparent anesthesia complications

## 2011-09-18 NOTE — Transfer of Care (Signed)
Immediate Anesthesia Transfer of Care Note  Patient: [name]  Procedure(s) Performed:  LAPAROSCOPIC CHOLECYSTECTOMY WITH INTRAOPERATIVE CHOLANGIOGRAM  Patient Location: PACU  Anesthesia Type: General  Level of Consciousness: awake, alert , oriented and patient cooperative  Airway & Oxygen Therapy: Patient Spontanous Breathing and Patient connected to nasal cannula oxygen  Post-op Assessment: Report given to PACU RN, Post -op Vital signs reviewed and stable and Patient moving all extremities  Post vital signs: Reviewed and stable  Complications: No apparent anesthesia complications

## 2011-09-18 NOTE — Brief Op Note (Signed)
09/17/2011 - 09/18/2011  4:03 PM  PATIENT:  [name], 25 y.o., male, MRN: [id number]  PREOP DIAGNOSIS:  GALLSTONES, cholecystitis   POSTOP DIAGNOSIS:   Edematous cholecystitis, cholelithiasis  PROCEDURE:   Procedure(s): LAPAROSCOPIC CHOLECYSTECTOMY WITH INTRAOPERATIVE CHOLANGIOGRAM  SURGEON:   Ovidio Kin, M.D.  ASSISTANTTrude Mcburney, MD  ANESTHESIA:   general  EBL:  min  ml  Total I/O In: 1300 [I.V.:1300] Out: 1350 [Urine:1300; Blood:50]  BLOOD ADMINISTERED: none  DRAINS: none   LOCAL MEDICATIONS USED:   20 cc 1/4% marcaine  SPECIMEN:   Gall bladder  COUNTS CORRECT:  YES  INDICATIONS FOR PROCEDURE:  [name] is a 25 y.o. (DOB: Jul 07, 1986) AA male whose primary care physician is No primary provider on file. and comes for cholecystectomy.   The indications and risks of the surgery were explained to the patient.  The risks include, but are not limited to, infection, bleeding, and nerve injury.  Note dictated to:   #[id number]  DN  09/18/2011

## 2011-09-18 NOTE — Anesthesia Procedure Notes (Signed)
Procedure Name: Intubation Date/Time: 09/18/2011 2:30 PM Performed by: Tyrone Nine Pre-anesthesia Checklist: Patient identified, Emergency Drugs available, Suction available and Patient being monitored Patient Re-evaluated:Patient Re-evaluated prior to inductionOxygen Delivery Method: Circle System Utilized Preoxygenation: Pre-oxygenation with 100% oxygen Intubation Type: IV induction Ventilation: Mask ventilation without difficulty Laryngoscope Size: Mac and 3 Grade View: Grade I Tube type: Oral Tube size: 7.5 mm Number of attempts: 1 Airway Equipment and Method: patient positioned with wedge pillow and stylet Placement Confirmation: ETT inserted through vocal cords under direct vision and positive ETCO2 Secured at: 23 cm Tube secured with: Tape Dental Injury: Teeth and Oropharynx as per pre-operative assessment

## 2011-09-18 NOTE — Anesthesia Preprocedure Evaluation (Addendum)
Anesthesia Evaluation  Patient identified by MRN, date of birth, ID band Patient awake    Reviewed: Allergy & Precautions, H&P , NPO status , Patient's Chart, lab work & pertinent test results  Airway Mallampati: I TM Distance: >3 FB   Mouth opening: Limited Mouth Opening  Dental No notable dental hx. (+) Teeth Intact   Pulmonary neg pulmonary ROS,  clear to auscultation        Cardiovascular neg cardio ROS Regular Normal    Neuro/Psych Negative Neurological ROS  Negative Psych ROS   GI/Hepatic Neg liver ROS, No vomiting today.   Endo/Other  Negative Endocrine ROS  Renal/GU negative Renal ROS  Genitourinary negative   Musculoskeletal negative musculoskeletal ROS (+)   Abdominal Normal abdominal exam  (+)   Peds negative pediatric ROS (+)  Hematology negative hematology ROS (+)   Anesthesia Other Findings   Reproductive/Obstetrics negative OB ROS                           Anesthesia Physical Anesthesia Plan  ASA: I  Anesthesia Plan: General   Post-op Pain Management:    Induction: Intravenous  Airway Management Planned: Oral ETT  Additional Equipment:   Intra-op Plan:   Post-operative Plan: Extubation in OR  Informed Consent: I have reviewed the patients History and Physical, chart, labs and discussed the procedure including the risks, benefits and alternatives for the proposed anesthesia with the patient or authorized representative who has indicated his/her understanding and acceptance.   Dental advisory given  Plan Discussed with: CRNA and Anesthesiologist  Anesthesia Plan Comments:         Anesthesia Quick Evaluation

## 2011-09-19 LAB — DIFFERENTIAL
Basophils Absolute: 0.1 10*3/uL (ref 0.0–0.1)
Basophils Relative: 0 % (ref 0–1)
Eosinophils Absolute: 0.2 10*3/uL (ref 0.0–0.7)
Eosinophils Relative: 2 % (ref 0–5)
Lymphocytes Relative: 19 % (ref 12–46)
Lymphs Abs: 2.2 10*3/uL (ref 0.7–4.0)
Monocytes Absolute: 1.2 10*3/uL — ABNORMAL HIGH (ref 0.1–1.0)
Monocytes Relative: 10 % (ref 3–12)
Neutro Abs: 7.8 10*3/uL — ABNORMAL HIGH (ref 1.7–7.7)
Neutrophils Relative %: 68 % (ref 43–77)

## 2011-09-19 LAB — CBC
HCT: 42.1 % (ref 39.0–52.0)
Hemoglobin: 14.3 g/dL (ref 13.0–17.0)
MCH: 27 pg (ref 26.0–34.0)
MCHC: 34 g/dL (ref 30.0–36.0)
MCV: 79.4 fL (ref 78.0–100.0)
Platelets: 253 10*3/uL (ref 150–400)
RBC: 5.3 MIL/uL (ref 4.22–5.81)
RDW: 14.2 % (ref 11.5–15.5)
WBC: 11.4 10*3/uL — ABNORMAL HIGH (ref 4.0–10.5)

## 2011-09-19 LAB — BASIC METABOLIC PANEL
BUN: 10 mg/dL (ref 6–23)
CO2: 29 mEq/L (ref 19–32)
Calcium: 8.4 mg/dL (ref 8.4–10.5)
Chloride: 104 mEq/L (ref 96–112)
Creatinine, Ser: 0.92 mg/dL (ref 0.50–1.35)
GFR calc Af Amer: >90 mL/min (ref >90)
GFR calc non Af Amer: >90 mL/min (ref >90)
Glucose, Bld: 101 mg/dL — ABNORMAL HIGH (ref 70–99)
Potassium: 3.9 mEq/L (ref 3.5–5.1)
Sodium: 140 mEq/L (ref 135–145)

## 2011-09-19 MED ORDER — HYDROCODONE-ACETAMINOPHEN 5-325 MG PO TABS: 1.0000 | ORAL_TABLET | ORAL | Status: AC | PRN

## 2011-09-19 MED ORDER — ACETAMINOPHEN 325 MG PO TABS: 650.0000 mg | ORAL_TABLET | Freq: Four times a day (QID) | ORAL | Status: AC | PRN

## 2011-09-19 NOTE — Discharge Summary (Signed)
Physician Discharge Summary  Patient ID: [name] MRN: [id number] DOB/AGE: September 19, 1986 25 y.o.  Admit date: 09/17/2011 Discharge date: 09/19/2011  Admission Diagnoses: Acute cholecystitis, cholelithiasis  Discharge Diagnoses: Edematous cholecystitis with cholelithiasis Active Problems:  Cholecystitis with cholelithiasis   PROCEDURES: Laparoscopic cholecystectomy with intraoperative cholangiogram 09/18/2011 Dr. Ezzard Standing.  Hospital Course: Patient is a 25 year old African American male who presented with abdominal pain nausea and vomiting the emergency room. He was found to have this problem in September of this year. He was seen in the ER [facility] referred for general surgery as an outpatient. He was unable to follow a returns to the emergency room with recurrent abdominal pain. Ultrasound showed a stone which did not move sludge and gallbladder wall thickness up to 3 mm. He was admitted for elective cholecystectomy, and antibiotics. White count admission was 14,600 . He was placed on IV antibiotics, and underwent laparoscopic cholecystectomy 09/18/2011. Postoperatively he is doing well . He is hemodynamically stable, voiding, and eating a regular diet. His incisions looked good and we plan to discharge him home today.  Followup will be in 2 weeks Dr. Allene Pyo office. Discharge meds will include plain Tylenol and hydrocodone/APAP 5/325 every 4 hours when necessary as needed. Condition on discharge: Improved. [name]         Disposition: Home or Self Care   Current Discharge Medication List    START taking these medications   Details  acetaminophen (TYLENOL) 325 MG tablet Take 2 tablets (650 mg total) by mouth every 6 (six) hours as needed (or Temp > 100). Qty: 30 tablet, Refills: 0    HYDROcodone-acetaminophen (NORCO) 5-325 MG per tablet Take 1-2 tablets by mouth every 4 (four) hours as needed. Qty: 40 tablet,  Refills: 0       Follow-up Information    Follow up with NEWMAN,DAVID H, MD. Make an appointment in 2 weeks. (Call if you have problems)    Contact information:   [facility] [address], [address] [place] [address] [phone]          Signed: Sherrie George 09/19/2011, 11:03 AM

## 2011-09-19 NOTE — Progress Notes (Signed)
1 Day Post-Op  Subjective: He's a bit sore, Had regular breakfast. Ambulated and voided without issue.  Objective: Vital signs in last 24 hours: Temp:  [98.1 F (36.7 C)-99.2 F (37.3 C)] 98.2 F (36.8 C) (11/28 1000) Pulse Rate:  [80-96] 80  (11/28 1000) Resp:  [18-23] 21  (11/28 1000) BP: (108-143)/(51-74) 119/65 mmHg (11/28 1000) SpO2:  [94 %-100 %] 97 % (11/28 1000) Last BM Date: 09/17/11  Intake/Output from previous day: 11/27 0701 - 11/28 0700 In: 3610 [P.O.:120; I.V.:3490] Out: 4500 [Urine:4450; Blood:50] Intake/Output this shift: Total I/O In: 240 [P.O.:240] Out: 451 [Urine:450; Stool:1]  General appearance: alert, cooperative and no distress GI: soft, non-tender; bowel sounds normal; no masses,  no organomegaly and incisions look good  Lab Results:   Basename 09/19/11 0650 09/18/11 0505  WBC 11.4* 13.7*  HGB 14.3 14.4  HCT 42.1 42.7  PLT 253 260    BMET  Basename 09/19/11 0650 09/18/11 0505  NA 140 138  K 3.9 3.8  CL 104 103  CO2 29 28  GLUCOSE 101* 102*  BUN 10 10  CREATININE 0.92 0.94  CALCIUM 8.4 8.5   PT/INR No results found for this basename: LABPROT:2,INR:2 in the last 72 hours   Studies/Results: Dg Cholangiogram Operative  09/18/2011  *RADIOLOGY REPORT*  Clinical Data:   Cholecystectomy.  INTRAOPERATIVE CHOLANGIOGRAM  Technique:  Cholangiographic images from the C-arm fluoroscopic device were submitted for interpretation post-operatively.  Please see the procedural report for the amount of contrast and the fluoroscopy time utilized.  Comparison:  09/17/2011 ultrasound.  Findings:  Two intraoperative cholangiogram sequences submitted for review after surgery.  No filling defect to suggest common bile duct stone.  Immediate spillage into the duodenum.  Narrowing of the common hepatic duct.  Etiology indeterminate.  Incomplete filling of the intrahepatic biliary ducts.  IMPRESSION: No filling defect to suggest common bile duct stone.  Immediate  spillage into the duodenum.  Narrowing of the common hepatic duct.  Etiology indeterminate.  Incomplete filling of the intrahepatic biliary ducts  Original Report Authenticated By: Fuller Canada, M.D.   US Abdomen Complete  09/17/2011  *RADIOLOGY REPORT*  Clinical Data:  Right upper quadrant pain.  Evaluate for cholecystitis.  ABDOMINAL ULTRASOUND COMPLETE  Comparison:  Abdominal ultrasound 07/13/2011 and 07/02/2011  Findings:  Gallbladder:  There is a 1.9 cm gallstone within the gallbladder neck.  This stone does not change position with changes in patient positioning.  Small amount of gallbladder sludge is also noted. Gallbladder wall thickness is upper normal at 3.0 mm.  The sonographic Murphy's sign is negative.  Common Bile Duct:  Measures 4.2 mm, within normal limits in caliber.  Liver: No focal mass lesion identified.  Within normal limits in parenchymal echogenicity.  IVC:  Appears normal.  Pancreas:  No abnormality identified.  Spleen:  Within normal limits in size and echotexture.  Right kidney:  Normal in size and parenchymal echogenicity.  No evidence of mass or hydronephrosis.  Left kidney:  Normal in size and parenchymal echogenicity.  No evidence of mass or hydronephrosis.  Abdominal Aorta:  No aneurysm identified.  IMPRESSION: 1. Cholelithiasis. A 1.9 cm gallstone within the gallbladder neck appears immobile.  This has similar appearances compared to the prior ultrasounds of September 2012.  No definite evidence of acute cholecystitis (gallbladder wall is upper limits of normal and the sonographic Murphy's sign is negative). 2.  Remainder of the abdominal ultrasound is within normal limits.  Original Report Authenticated By: Britta Mccreedy, M.D.  Anti-infectives: Anti-infectives     Start     Dose/Rate Route Frequency Ordered Stop   09/17/11 1730   ciprofloxacin (CIPRO) IVPB 400 mg        400 mg 200 mL/hr over 60 Minutes Intravenous Every 12 hours 09/17/11 1715           Current  Facility-Administered Medications  Medication Dose Route Frequency Provider Last Rate Last Dose  . acetaminophen (TYLENOL) tablet 650 mg  650 mg Oral Q6H PRN Sherrie George, PA       Or  . acetaminophen (TYLENOL) suppository 650 mg  650 mg Rectal Q6H PRN Sherrie George, PA      . ciprofloxacin (CIPRO) IVPB 400 mg  400 mg Intravenous Q12H Sherrie George, PA   400 mg at 09/19/11 0511  . dextrose 5 % and 0.45 % NaCl with KCl 20 mEq/L infusion   Intravenous To Minor Christella Hartigan, PHARMD 100 mL/hr at 09/18/11 0120    . dextrose 5 % and 0.45 % NaCl with KCl 20 mEq/L infusion   Intravenous Continuous Kandis Cocking, MD 125 mL/hr at 09/19/11 0816    . heparin injection 5,000 Units  5,000 Units Subcutaneous Q8H Sherrie George, [place]   5,000 Units at 09/19/11 0510  . HYDROcodone-acetaminophen (NORCO) 5-325 MG per tablet 1-2 tablet  1-2 tablet Oral Q4H PRN Kandis Cocking, MD   2 tablet at 09/19/11 [id number]  . HYDROmorphone (DILAUDID) injection 0.5-1 mg  0.5-1 mg Intravenous Q1H PRN Sherrie George, PA   0.5 mg at 09/18/11 1659  . influenza  inactive virus vaccine (FLUZONE/FLUARIX) injection 0.5 mL  0.5 mL Intramuscular Tomorrow-1000 Kandis Cocking, MD   0.5 mL at 09/19/11 1003  . morphine 2 MG/ML injection 1-3 mg  1-3 mg Intravenous Q2H PRN Kandis Cocking, MD   2 mg at 09/18/11 1949  . ondansetron (ZOFRAN) injection 4 mg  4 mg Intravenous Q6H PRN Sherrie George, PA      . ondansetron [facility]) tablet 4 mg  4 mg Oral Q6H PRN Kandis Cocking, MD       Or  . ondansetron [facility]) injection 4 mg  4 mg Intravenous Q6H PRN Kandis Cocking, MD      . DISCONTD: antiseptic oral rinse (BIOTENE) solution 15 mL  15 mL Mouth Rinse q12n4p Kandis Cocking, MD      . DISCONTD: bupivacaine (MARCAINE) 0.25 % injection    PRN Kandis Cocking, MD   30 mL at 09/18/11 1408  . DISCONTD: chlorhexidine (PERIDEX) 0.12 % solution 15 mL  15 mL Mouth Rinse BID Kandis Cocking, MD   15 mL at 09/18/11 2248  . DISCONTD: heparin injection  5,000 Units  5,000 Units Subcutaneous Q8H Kandis Cocking, MD      . DISCONTD: iohexol (OMNIPAQUE) 300 MG/ML injection    PRN Kandis Cocking, MD   13 mL at 09/18/11 1410  . DISCONTD: sodium chloride irrigation 0.9 %    PRN Kandis Cocking, MD   1,000 mL at 09/18/11 1407  . DISCONTD: sodium chloride irrigation 0.9 %    PRN Kandis Cocking, MD   1,000 mL at 09/18/11 1408   Facility-Administered Medications Ordered in Other Encounters  Medication Dose Route Frequency Provider Last Rate Last Dose  . DISCONTD: fentaNYL (SUBLIMAZE) injection    PRN Chriss Czar Sauve   50 mcg at 09/18/11 1636  . DISCONTD: lactated ringers infusion    Continuous PRN Chriss Czar Sauve      .  DISCONTD: lidocaine (cardiac) 100 mg/72ml (XYLOCAINE) 20 MG/ML injection 2%    PRN Chriss Czar Sauve   100 mg at 09/18/11 1430  . DISCONTD: ondansetron (ZOFRAN) injection    PRN Vikki Ports Mumm   4 mg at 09/18/11 1549  . DISCONTD: propofol (DIPRIVAN) 10 MG/ML infusion    PRN Chriss Czar Sauve   200 mg at 09/18/11 1430  . DISCONTD: rocuronium (ZEMURON) injection    PRN Chriss Czar Sauve   80 mg at 09/18/11 1430    Assessment/Plan  POD 1 CHOLCYSTECTOMY Patient Active Problem List  Diagnoses  . Cholecystitis with cholelithiasis  PLAN: D/C HOME    LOS: 2 days    Rochelle Larue 09/19/2011

## 2011-09-21 ENCOUNTER — Encounter (HOSPITAL_COMMUNITY): Payer: Self-pay | Admitting: Surgery

## 2011-12-21 ENCOUNTER — Emergency Department (HOSPITAL_COMMUNITY)
Admission: EM | Admit: 2011-12-21 | Discharge: 2011-12-21 | Disposition: A | Discharge status: Home Health | Payer: No Typology Code available for payment source | Attending: Emergency Medicine | Admitting: Emergency Medicine

## 2011-12-21 ENCOUNTER — Emergency Department (HOSPITAL_COMMUNITY): Payer: No Typology Code available for payment source

## 2011-12-21 ENCOUNTER — Encounter (HOSPITAL_COMMUNITY): Payer: Self-pay | Admitting: *Deleted

## 2011-12-21 DIAGNOSIS — R51 Headache: Secondary | ICD-10-CM | POA: Insufficient documentation

## 2011-12-21 DIAGNOSIS — M542 Cervicalgia: Secondary | ICD-10-CM | POA: Insufficient documentation

## 2011-12-21 DIAGNOSIS — M25519 Pain in unspecified shoulder: Secondary | ICD-10-CM | POA: Insufficient documentation

## 2011-12-21 MED ORDER — CYCLOBENZAPRINE HCL 10 MG PO TABS: 10.0000 mg | ORAL_TABLET | Freq: Two times a day (BID) | ORAL | Status: AC | PRN

## 2011-12-21 MED ORDER — IBUPROFEN 800 MG PO TABS
800.0000 mg | ORAL_TABLET | Freq: Once | ORAL | Status: AC
  Administered 2011-12-21: 800 mg via ORAL
  Filled 2011-12-21: qty 1

## 2011-12-21 MED ORDER — IBUPROFEN 800 MG PO TABS: 800.0000 mg | ORAL_TABLET | Freq: Three times a day (TID) | ORAL | Status: AC
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
## 2011-12-21 NOTE — ED Notes (Signed)
Returned from ct 
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
## 2011-12-22 NOTE — ED Provider Notes (Signed)
Medical screening examination/treatment/procedure(s) were performed by non-physician practitioner and as supervising physician I was immediately available for consultation/collaboration.   Glynn Octave, MD 12/22/11 1324

## 2012-01-01 ENCOUNTER — Emergency Department (HOSPITAL_COMMUNITY)
Admission: EM | Admit: 2012-01-01 | Discharge: 2012-01-01 | Discharge status: Against Medical Advice | Payer: Self-pay | Attending: Emergency Medicine | Admitting: Emergency Medicine

## 2012-01-01 ENCOUNTER — Encounter (HOSPITAL_COMMUNITY): Payer: Self-pay | Admitting: *Deleted

## 2012-01-01 DIAGNOSIS — H9190 Unspecified hearing loss, unspecified ear: Secondary | ICD-10-CM | POA: Insufficient documentation

## 2012-01-01 NOTE — ED Notes (Signed)
Unable to hear from his lt ear for one hour,  nio previous history

## 2012-01-26 ENCOUNTER — Emergency Department (HOSPITAL_COMMUNITY)
Admission: EM | Admit: 2012-01-26 | Discharge: 2012-01-26 | Disposition: A | Discharge status: Home / Self Care | Payer: Self-pay | Attending: Emergency Medicine | Admitting: Emergency Medicine

## 2012-01-26 ENCOUNTER — Encounter (HOSPITAL_COMMUNITY): Payer: Self-pay | Admitting: *Deleted

## 2012-01-26 DIAGNOSIS — S025XXA Fracture of tooth (traumatic), initial encounter for closed fracture: Secondary | ICD-10-CM | POA: Insufficient documentation

## 2012-01-26 DIAGNOSIS — K089 Disorder of teeth and supporting structures, unspecified: Secondary | ICD-10-CM | POA: Insufficient documentation

## 2012-01-26 DIAGNOSIS — R51 Headache: Secondary | ICD-10-CM | POA: Insufficient documentation

## 2012-01-26 DIAGNOSIS — K0889 Other specified disorders of teeth and supporting structures: Secondary | ICD-10-CM

## 2012-01-26 DIAGNOSIS — X58XXXA Exposure to other specified factors, initial encounter: Secondary | ICD-10-CM | POA: Insufficient documentation

## 2012-01-26 MED ORDER — PENICILLIN V POTASSIUM 250 MG PO TABS: 250.0000 mg | ORAL_TABLET | Freq: Four times a day (QID) | ORAL | Status: AC

## 2012-01-26 MED ORDER — HYDROCODONE-ACETAMINOPHEN 5-325 MG PO TABS: 1.0000 | ORAL_TABLET | ORAL | Status: AC | PRN

## 2012-01-26 NOTE — ED Provider Notes (Signed)
History     CSN: [id number]  Arrival date & time 01/26/12  1813   First MD Initiated Contact with Patient 01/26/12 1958      Chief Complaint  Patient presents with  . Dental Pain    (Consider location/radiation/quality/duration/timing/severity/associated sxs/prior treatment) HPI History from patient. He presents with pain to "the whole right side of my face" which he believes is coming from one of his teeth. He does have a broken upper right molar, which he states broke about a month ago but has not been painful until today. Pain started today and has worsened throughout the day. He has been unable to eat due to the pain. He denies difficulty swallowing or breathing. He has not noticed any neck swelling or facial swelling. Denies fever or chills.  History reviewed. No pertinent past medical history.  Past Surgical History  Procedure Date  . Inguinal hernia repair age 9  . Cholecystectomy     planned for today  09/18/2011  . Cholecystectomy 09/18/2011    Procedure: LAPAROSCOPIC CHOLECYSTECTOMY WITH INTRAOPERATIVE CHOLANGIOGRAM;  Surgeon: Kandis Cocking, MD;  Location: [facility] OR;  Service: General;  Laterality: N/A;    History reviewed. No pertinent family history.  History  Substance Use Topics  . Smoking status: Never Smoker   . Smokeless tobacco: Not on file  . Alcohol Use: No      Review of Systems as per history of present illness  Allergies  Review of patient's allergies indicates no known allergies.  Home Medications  No current outpatient prescriptions on file.  BP 134/73  Pulse 92  Temp(Src) 98.3 F (36.8 C) (Oral)  Resp 20  SpO2 97%  Physical Exam  Nursing note and vitals reviewed. Constitutional: He appears well-developed and well-nourished. No distress.  HENT:  Head: Normocephalic and atraumatic.  Mouth/Throat: Oropharynx is clear and moist. No oropharyngeal exudate.       Broken right first upper molar. Tooth is mildly tender to palpation. No  evidence for abscess. No trismus. No facial swelling or tenderness to palpation noted.  Eyes: EOM are normal. Pupils are equal, round, and reactive to light.  Neck: Normal range of motion. Neck supple.       No neck swelling  Cardiovascular: Normal rate.   Pulmonary/Chest: Effort normal.  Lymphadenopathy:    He has no cervical adenopathy.  Neurological: He is alert.  Skin: Skin is warm and dry. No rash noted. He is not diaphoretic.  Psychiatric: He has a normal mood and affect.    ED Course  Procedures (including critical care time)  Labs Reviewed - No data to display No results found.   1. Pain, dental       MDM  Patient presents with dental pain. He does have a broken tooth and tenderness to palpation. He was given prescriptions for pain medication and penicillin. He was instructed to make a followup appointment with the dentist on call. He was also provided with resource list for other dental resources in the area. Return precautions discussed.        [place], [place] 01/27/12 [phone]

## 2012-01-26 NOTE — ED Notes (Signed)
Pt. Has swelling and pain  To the "whole right side of his face."  This started about 2 hours ago.  Pt. Is noted to have a broken tooth at the top right rear of his mouth.  Pt. Also reports having diarrhea.

## 2012-01-26 NOTE — ED Notes (Signed)
Pt states understanding of discharge instructions 

## 2012-01-26 NOTE — Discharge Instructions (Signed)
You have been given a prescription for antibiotics and a pain medicine. Take ALL of the antibiotic until it is gone. Use the pain medication as needed. Please call Dr. Moshe Cipro office on Monday in order to establish a follow up appointment. If you develop worsening facial swelling, trouble swallowing or breathing, or otherwise worsening condition, please return to the ER immediately.  RESOURCE GUIDE  Dental Problems  Patients with Medicaid: [facility] [phone] [address]                                           [phone] [address] Phone:  [phone]                                                  Phone:  [phone]  If unable to pay or uninsured, contact:  [facility] or [facility]. to become qualified for the adult dental clinic.  Chronic Pain Problems Contact [facility]  [phone] Patients need to be referred by their primary care doctor.  Insufficient Money for Medicine Contact [employer]:  call "211" or [facility] 520-069-8795.  No Primary Care Doctor Call [facility]  [phone] Other agencies that provide inexpensive medical care    [facility]  [phone]    [facility]  [phone]    [facility]  [phone]    [facility]  [phone]    [facility]  [phone]    [facility]  [phone]  Psychological Services [facility] Behavioral Health  [phone] [facility] Services  [phone] [facility] Mental Health   [phone] (emergency services 2104923222)  Substance Abuse Resources [facility]  [phone] [facility] ([phone] [facility] [phone] Floydene Flock [phone] Residential & Outpatient Substance Abuse Program  [phone]  Abuse/Neglect [facility] Child Abuse Hotline (616)571-0064 [facility] Child Abuse Hotline 731-059-5485 (After Hours)  Emergency  Shelter [facility] Ministries 820-059-5343  Maternity Homes [facility] 316-833-0689 [facility] [phone]  MRSA Hotline #:   (574)575-8571    [facility] Resources  [facility]     [employer]                          [facility] Dept. [address] [place]                       [address]      [address]  [place]                                                Cristobal Goldmann Phone:  [phone]  Phone:  [phone]                 Phone:  [phone]  [facility] Mental Health Phone:  [phone]  [facility] Child Abuse Hotline (551) 388-8312 936-640-9191 (After Hours)  Dental Pain A tooth ache may be caused by cavities (tooth decay). Cavities expose the nerve of the tooth to air and hot or cold temperatures. It may come from an infection or abscess (also called a boil or furuncle) around your tooth. It is also often caused by dental caries (tooth decay). This causes the pain you are having. DIAGNOSIS  Your caregiver can diagnose this problem by exam. TREATMENT   If caused by an infection, it may be treated with medications which kill germs (antibiotics) and pain medications as prescribed by your caregiver. Take medications as directed.   Only take over-the-counter or prescription medicines for pain, discomfort, or fever as directed by your caregiver.   Whether the tooth ache today is caused by infection or dental disease, you should see your dentist as soon as possible for further care.  SEEK MEDICAL CARE IF: The exam and treatment you received today has been provided on an emergency basis only. This is not a substitute for complete medical or dental care. If your problem worsens or new problems (symptoms) appear, and you are unable to meet with your dentist, call or return to this location. SEEK IMMEDIATE  MEDICAL CARE IF:   You have a fever.   You develop redness and swelling of your face, jaw, or neck.   You are unable to open your mouth.   You have severe pain uncontrolled by pain medicine.  MAKE SURE YOU:   Understand these instructions.   Will watch your condition.   Will get help right away if you are not doing well or get worse.  Document Released: 10/08/2005 Document Revised: 09/27/2011 Document Reviewed: 05/26/2008 [facility] Patient Information 2012 [place], [place].

## 2012-01-27 NOTE — ED Provider Notes (Signed)
Medical screening examination/treatment/procedure(s) were performed by non-physician practitioner and as supervising physician I was immediately available for consultation/collaboration.  Doug Sou, MD 01/27/12 1459

## 2013-01-29 ENCOUNTER — Encounter (HOSPITAL_COMMUNITY): Payer: Self-pay | Admitting: *Deleted

## 2013-01-29 ENCOUNTER — Emergency Department (HOSPITAL_COMMUNITY)
Admission: EM | Admit: 2013-01-29 | Discharge: 2013-01-29 | Disposition: A | Discharge status: Home / Self Care | Payer: Self-pay | Attending: Emergency Medicine | Admitting: Emergency Medicine

## 2013-01-29 DIAGNOSIS — R197 Diarrhea, unspecified: Secondary | ICD-10-CM | POA: Insufficient documentation

## 2013-01-29 DIAGNOSIS — Z9089 Acquired absence of other organs: Secondary | ICD-10-CM | POA: Insufficient documentation

## 2013-01-29 DIAGNOSIS — R109 Unspecified abdominal pain: Secondary | ICD-10-CM

## 2013-01-29 DIAGNOSIS — R1011 Right upper quadrant pain: Secondary | ICD-10-CM | POA: Insufficient documentation

## 2013-01-29 LAB — COMPREHENSIVE METABOLIC PANEL
ALT: 24 U/L (ref 0–53)
AST: 37 U/L (ref 0–37)
Albumin: 3.4 g/dL — ABNORMAL LOW (ref 3.5–5.2)
Alkaline Phosphatase: 60 U/L (ref 39–117)
BUN: 12 mg/dL (ref 6–23)
CO2: 29 mEq/L (ref 19–32)
Calcium: 9.1 mg/dL (ref 8.4–10.5)
Chloride: 106 mEq/L (ref 96–112)
Creatinine, Ser: 0.9 mg/dL (ref 0.50–1.35)
GFR calc Af Amer: >90 mL/min (ref >90)
GFR calc non Af Amer: >90 mL/min (ref >90)
Glucose, Bld: 105 mg/dL — ABNORMAL HIGH (ref 70–99)
Potassium: 3.9 mEq/L (ref 3.5–5.1)
Sodium: 143 mEq/L (ref 135–145)
Total Bilirubin: 0.4 mg/dL (ref 0.3–1.2)
Total Protein: 7.1 g/dL (ref 6.0–8.3)

## 2013-01-29 LAB — CBC WITH DIFFERENTIAL/PLATELET
Basophils Absolute: 0 10*3/uL (ref 0.0–0.1)
Basophils Relative: 0 % (ref 0–1)
Eosinophils Absolute: 0.2 10*3/uL (ref 0.0–0.7)
Eosinophils Relative: 2 % (ref 0–5)
HCT: 42.8 % (ref 39.0–52.0)
Hemoglobin: 14.3 g/dL (ref 13.0–17.0)
Lymphocytes Relative: 20 % (ref 12–46)
Lymphs Abs: 1.9 10*3/uL (ref 0.7–4.0)
MCH: 26.7 pg (ref 26.0–34.0)
MCHC: 33.4 g/dL (ref 30.0–36.0)
MCV: 79.9 fL (ref 78.0–100.0)
Monocytes Absolute: 0.7 10*3/uL (ref 0.1–1.0)
Monocytes Relative: 7 % (ref 3–12)
Neutro Abs: 6.8 10*3/uL (ref 1.7–7.7)
Neutrophils Relative %: 71 % (ref 43–77)
Platelets: 298 10*3/uL (ref 150–400)
RBC: 5.36 MIL/uL (ref 4.22–5.81)
RDW: 15 % (ref 11.5–15.5)
WBC: 9.6 10*3/uL (ref 4.0–10.5)

## 2013-01-29 LAB — LIPASE, BLOOD: Lipase: 31 U/L (ref 11–59)

## 2013-01-29 MED ORDER — DIPHENOXYLATE-ATROPINE 2.5-0.025 MG PO TABS: 1.0000 | ORAL_TABLET | Freq: Four times a day (QID) | ORAL | Status: DC | PRN

## 2013-01-29 MED ORDER — PROMETHAZINE HCL 25 MG PO TABS: 25.0000 mg | ORAL_TABLET | Freq: Four times a day (QID) | ORAL | Status: DC | PRN

## 2013-01-29 MED ORDER — DIPHENOXYLATE-ATROPINE 2.5-0.025 MG PO TABS
2.0000 | ORAL_TABLET | Freq: Once | ORAL | Status: AC
  Administered 2013-01-29: 2 via ORAL
  Filled 2013-01-29: qty 2

## 2013-01-29 MED ORDER — ONDANSETRON HCL 4 MG/2ML IJ SOLN
4.0000 mg | Freq: Once | INTRAMUSCULAR | Status: AC
  Administered 2013-01-29: 4 mg via INTRAVENOUS
  Filled 2013-01-29: qty 2

## 2013-01-29 MED ORDER — SODIUM CHLORIDE 0.9 % IV BOLUS (SEPSIS)
1000.0000 mL | Freq: Once | INTRAVENOUS | Status: AC
  Administered 2013-01-29: 1000 mL via INTRAVENOUS

## 2013-01-29 NOTE — ED Provider Notes (Signed)
History     CSN: [id number]  Arrival date & time 01/29/13  1911   First MD Initiated Contact with Patient 01/29/13 1923      Chief Complaint  Patient presents with  . Abdominal Pain    (Consider location/radiation/quality/duration/timing/severity/associated sxs/prior treatment) HPI Comments: Patient comes to the ER for evaluation of vomiting or diarrhea. Patient reports that the symptoms began earlier today. He is complaining of cramping pain across his upper abdomen associated with nonbloody diarrhea. He has had 3 stools today. No nausea or vomiting. Patient was recently incarcerated, just got out of prison and has been eating a very different diet. He does have a previous history of cholecystectomy. He has not had a fever.  Patient is a 27 y.o. male presenting with abdominal pain.  Abdominal Pain Associated symptoms: diarrhea   Associated symptoms: no fever     History reviewed. No pertinent past medical history.  Past Surgical History  Procedure Laterality Date  . Inguinal hernia repair  age 39  . Cholecystectomy      planned for today  09/18/2011  . Cholecystectomy  09/18/2011    Procedure: LAPAROSCOPIC CHOLECYSTECTOMY WITH INTRAOPERATIVE CHOLANGIOGRAM;  Surgeon: Kandis Cocking, MD;  Location: [facility] OR;  Service: General;  Laterality: N/A;    History reviewed. No pertinent family history.  History  Substance Use Topics  . Smoking status: Never Smoker   . Smokeless tobacco: Not on file  . Alcohol Use: Yes     Comment: on occasion      Review of Systems  Constitutional: Negative for fever.  Gastrointestinal: Positive for abdominal pain and diarrhea.  All other systems reviewed and are negative.    Allergies  Review of patient's allergies indicates no known allergies.  Home Medications  No current outpatient prescriptions on file.  BP 134/67  Pulse 95  Temp(Src) 98 F (36.7 C) (Oral)  Resp 16  Ht 5\' 8"  (1.727 m)  Wt 223 lb (101.152 kg)  BMI 33.91 kg/m2   SpO2 98%  Physical Exam  Constitutional: He is oriented to person, place, and time. He appears well-developed and well-nourished. No distress.  HENT:  Head: Normocephalic and atraumatic.  Right Ear: Hearing normal.  Nose: Nose normal.  Mouth/Throat: Oropharynx is clear and moist and mucous membranes are normal.  Eyes: Conjunctivae and EOM are normal. Pupils are equal, round, and reactive to light.  Neck: Normal range of motion. Neck supple.  Cardiovascular: Normal rate, regular rhythm, S1 normal and S2 normal.  Exam reveals no gallop and no friction rub.   No murmur heard. Pulmonary/Chest: Effort normal and breath sounds normal. No respiratory distress. He exhibits no tenderness.  Abdominal: Soft. Normal appearance and bowel sounds are normal. There is no hepatosplenomegaly. There is tenderness in the epigastric area. There is no rebound, no guarding, no tenderness at McBurney's point and negative Murphy's sign. No hernia.  Musculoskeletal: Normal range of motion.  Neurological: He is alert and oriented to person, place, and time. He has normal strength. No cranial nerve deficit or sensory deficit. Coordination normal. GCS eye subscore is 4. GCS verbal subscore is 5. GCS motor subscore is 6.  Skin: Skin is warm, dry and intact. No rash noted. No cyanosis.  Psychiatric: He has a normal mood and affect. His speech is normal and behavior is normal. Thought content normal.    ED Course  Procedures (including critical care time)  Labs Reviewed  COMPREHENSIVE METABOLIC PANEL - Abnormal; Notable for the following:    Glucose,  Bld 105 (*)    Albumin 3.4 (*)    All other components within normal limits  CBC WITH DIFFERENTIAL  LIPASE, BLOOD   No results found.   Diagnosis: 1. Abdominal pain 2. Diarrhea    MDM  Patient comes to the ER for evaluation of diarrhea with abdominal pain and cramping. Abdominal exam is benign. Tenderness is in the epigastric region without signs. Patient does  not have a gallbladder. He was feeling better after IV fluids, Zofran and Lomotil. Patient will be discharged, continue symptomatic treatment.        Gilda Crease, MD 01/29/13 2052

## 2013-01-29 NOTE — ED Notes (Signed)
Pt states he started having diarrhea this am. Pt with 3 looses stools today. Pt also c/o abd pain. bil lower quad of abd.

## 2013-01-29 NOTE — ED Notes (Signed)
Patient is alert and oriented x3.  He is complaining of RUQ pain that started today.  He was recently released from prison 8 days ago.  He states that his diet has changed and has only had this pain x1.

## 2014-03-19 ENCOUNTER — Emergency Department (HOSPITAL_COMMUNITY)
Admission: EM | Admit: 2014-03-19 | Discharge: 2014-03-19 | Discharge status: Against Medical Advice | Payer: No Typology Code available for payment source | Attending: Emergency Medicine | Admitting: Emergency Medicine

## 2014-03-19 ENCOUNTER — Encounter (HOSPITAL_COMMUNITY): Payer: Self-pay | Admitting: Emergency Medicine

## 2014-03-19 ENCOUNTER — Emergency Department (HOSPITAL_COMMUNITY): Payer: No Typology Code available for payment source

## 2014-03-19 DIAGNOSIS — R079 Chest pain, unspecified: Secondary | ICD-10-CM | POA: Insufficient documentation

## 2014-03-19 DIAGNOSIS — R1012 Left upper quadrant pain: Secondary | ICD-10-CM | POA: Insufficient documentation

## 2014-03-19 LAB — CBC
HCT: 42.4 % (ref 39.0–52.0)
Hemoglobin: 14.8 g/dL (ref 13.0–17.0)
MCH: 28 pg (ref 26.0–34.0)
MCHC: 34.9 g/dL (ref 30.0–36.0)
MCV: 80.3 fL (ref 78.0–100.0)
Platelets: 341 10*3/uL (ref 150–400)
RBC: 5.28 MIL/uL (ref 4.22–5.81)
RDW: 14.7 % (ref 11.5–15.5)
WBC: 15.7 10*3/uL — ABNORMAL HIGH (ref 4.0–10.5)

## 2014-03-19 LAB — BASIC METABOLIC PANEL
BUN: 12 mg/dL (ref 6–23)
CO2: 22 mEq/L (ref 19–32)
Calcium: 9.3 mg/dL (ref 8.4–10.5)
Chloride: 103 mEq/L (ref 96–112)
Creatinine, Ser: 1.03 mg/dL (ref 0.50–1.35)
GFR calc Af Amer: >90 mL/min (ref >90)
GFR calc non Af Amer: >90 mL/min (ref >90)
Glucose, Bld: 110 mg/dL — ABNORMAL HIGH (ref 70–99)
Potassium: 3.6 mEq/L — ABNORMAL LOW (ref 3.7–5.3)
Sodium: 139 mEq/L (ref 137–147)

## 2014-03-19 LAB — I-STAT TROPONIN, ED: Troponin i, poc: 0 ng/mL (ref 0.00–0.08)

## 2014-03-19 LAB — LIPASE, BLOOD: Lipase: 25 U/L (ref 11–59)

## 2014-03-19 MED ORDER — ONDANSETRON 4 MG PO TBDP
8.0000 mg | ORAL_TABLET | Freq: Once | ORAL | Status: AC
  Administered 2014-03-19: 8 mg via ORAL
  Filled 2014-03-19: qty 2

## 2014-03-19 NOTE — ED Notes (Signed)
Patient was out with friends.  He states he had one beer.  He had onset of left upper abdominal pain,  Feels like it was squeezing.  Patient states his pain is 10/10.  Patient with no n/v/d.  Patient states his pain feels better when pressing on abdomen.  Patient has hx of similar sx.  Patient has hx of gallbladder issues.

## 2014-03-19 NOTE — ED Notes (Signed)
Called pt and no answer.  Will try again at a later time.

## 2014-03-19 NOTE — ED Notes (Signed)
Patient called to triage x3 without answer.

## 2014-03-19 NOTE — ED Notes (Signed)
Patient with some chest pain as well.  Patient with ekg completed.

## 2014-07-05 ENCOUNTER — Emergency Department (HOSPITAL_COMMUNITY)
Admission: EM | Admit: 2014-07-05 | Discharge: 2014-07-05 | Disposition: A | Discharge status: Home / Self Care | Payer: No Typology Code available for payment source | Attending: Emergency Medicine | Admitting: Emergency Medicine

## 2014-07-05 ENCOUNTER — Encounter (HOSPITAL_COMMUNITY): Payer: Self-pay | Admitting: Emergency Medicine

## 2014-07-05 ENCOUNTER — Emergency Department (HOSPITAL_COMMUNITY): Payer: No Typology Code available for payment source

## 2014-07-05 DIAGNOSIS — R1013 Epigastric pain: Secondary | ICD-10-CM | POA: Insufficient documentation

## 2014-07-05 DIAGNOSIS — Z9089 Acquired absence of other organs: Secondary | ICD-10-CM | POA: Diagnosis not present

## 2014-07-05 DIAGNOSIS — K2970 Gastritis, unspecified, without bleeding: Secondary | ICD-10-CM | POA: Insufficient documentation

## 2014-07-05 DIAGNOSIS — Z9889 Other specified postprocedural states: Secondary | ICD-10-CM | POA: Insufficient documentation

## 2014-07-05 DIAGNOSIS — K2990 Gastroduodenitis, unspecified, without bleeding: Secondary | ICD-10-CM

## 2014-07-05 DIAGNOSIS — R112 Nausea with vomiting, unspecified: Secondary | ICD-10-CM | POA: Insufficient documentation

## 2014-07-05 DIAGNOSIS — R197 Diarrhea, unspecified: Secondary | ICD-10-CM | POA: Diagnosis not present

## 2014-07-05 DIAGNOSIS — R109 Unspecified abdominal pain: Secondary | ICD-10-CM | POA: Insufficient documentation

## 2014-07-05 LAB — CBC
HCT: 47.5 % (ref 39.0–52.0)
Hemoglobin: 16.4 g/dL (ref 13.0–17.0)
MCH: 27.1 pg (ref 26.0–34.0)
MCHC: 34.5 g/dL (ref 30.0–36.0)
MCV: 78.4 fL (ref 78.0–100.0)
Platelets: 349 K/uL (ref 150–400)
RBC: 6.06 MIL/uL — ABNORMAL HIGH (ref 4.22–5.81)
RDW: 14.2 % (ref 11.5–15.5)
WBC: 18.7 K/uL — ABNORMAL HIGH (ref 4.0–10.5)

## 2014-07-05 LAB — COMPREHENSIVE METABOLIC PANEL WITH GFR
ALT: 23 U/L (ref 0–53)
AST: 18 U/L (ref 0–37)
Albumin: 3.8 g/dL (ref 3.5–5.2)
Alkaline Phosphatase: 75 U/L (ref 39–117)
Anion gap: 14 (ref 5–15)
BUN: 11 mg/dL (ref 6–23)
CO2: 23 meq/L (ref 19–32)
Calcium: 9.9 mg/dL (ref 8.4–10.5)
Chloride: 98 meq/L (ref 96–112)
Creatinine, Ser: 0.87 mg/dL (ref 0.50–1.35)
GFR calc Af Amer: >90 mL/min
GFR calc non Af Amer: >90 mL/min
Glucose, Bld: 96 mg/dL (ref 70–99)
Potassium: 4.3 meq/L (ref 3.7–5.3)
Sodium: 135 meq/L — ABNORMAL LOW (ref 137–147)
Total Bilirubin: 0.3 mg/dL (ref 0.3–1.2)
Total Protein: 8.6 g/dL — ABNORMAL HIGH (ref 6.0–8.3)

## 2014-07-05 LAB — URINALYSIS, ROUTINE W REFLEX MICROSCOPIC
Bilirubin Urine: NEGATIVE
Glucose, UA: NEGATIVE mg/dL
Hgb urine dipstick: NEGATIVE
Ketones, ur: NEGATIVE mg/dL
Leukocytes, UA: NEGATIVE
Nitrite: NEGATIVE
Protein, ur: NEGATIVE mg/dL
Specific Gravity, Urine: 1.021 (ref 1.005–1.030)
Urobilinogen, UA: 0.2 mg/dL (ref 0.0–1.0)
pH: 5 (ref 5.0–8.0)

## 2014-07-05 LAB — LIPASE, BLOOD: Lipase: 18 U/L (ref 11–59)

## 2014-07-05 MED ORDER — SODIUM CHLORIDE 0.9 % IV BOLUS (SEPSIS)
1000.0000 mL | Freq: Once | INTRAVENOUS | Status: AC
  Administered 2014-07-05: 1000 mL via INTRAVENOUS

## 2014-07-05 MED ORDER — OMEPRAZOLE 20 MG PO CPDR: 20.0000 mg | DELAYED_RELEASE_CAPSULE | Freq: Every day | ORAL | Status: DC

## 2014-07-05 MED ORDER — ONDANSETRON HCL 4 MG/2ML IJ SOLN
4.0000 mg | Freq: Once | INTRAMUSCULAR | Status: AC
Start: 2014-07-05 — End: 2014-07-05
  Administered 2014-07-05: 4 mg via INTRAVENOUS
  Filled 2014-07-05: qty 2

## 2014-07-05 MED ORDER — IOHEXOL 300 MG/ML  SOLN
50.0000 mL | Freq: Once | INTRAMUSCULAR | Status: AC | PRN
  Administered 2014-07-05: 50 mL via ORAL

## 2014-07-05 MED ORDER — MORPHINE SULFATE 4 MG/ML IJ SOLN
4.0000 mg | Freq: Once | INTRAMUSCULAR | Status: AC
  Administered 2014-07-05: 4 mg via INTRAVENOUS
  Filled 2014-07-05: qty 1

## 2014-07-05 MED ORDER — IOHEXOL 300 MG/ML  SOLN
100.0000 mL | Freq: Once | INTRAMUSCULAR | Status: AC | PRN
  Administered 2014-07-05: 100 mL via INTRAVENOUS

## 2014-07-05 MED ORDER — IOHEXOL 300 MG/ML  SOLN: 100.0000 mL | Freq: Once | INTRAMUSCULAR | Status: DC | PRN

## 2014-07-05 MED ORDER — PROMETHAZINE HCL 25 MG PO TABS: 25.0000 mg | ORAL_TABLET | Freq: Four times a day (QID) | ORAL | Status: DC | PRN

## 2014-07-05 NOTE — ED Provider Notes (Signed)
CSN: [id number]     Arrival date & time 07/05/14  1527 History   First MD Initiated Contact with Patient 07/05/14 1624     Chief Complaint  Patient presents with  . Abdominal Pain  . Emesis  . Diarrhea     (Consider location/radiation/quality/duration/timing/severity/associated sxs/prior Treatment) Patient is a 28 y.o. male presenting with abdominal pain, vomiting, and diarrhea. The history is provided by the patient and medical records. No language interpreter was used.  Abdominal Pain Associated symptoms: diarrhea, nausea and vomiting   Associated symptoms: no chest pain, no constipation, no cough, no dysuria, no fatigue, no fever, no hematuria and no shortness of breath   Emesis Associated symptoms: abdominal pain and diarrhea   Diarrhea Associated symptoms: abdominal pain and vomiting   Associated symptoms: no diaphoresis and no fever     [name] is a 28 y.o. male  with no major medical Hx presents to the Emergency Department complaining of intermittent persistent, progressively worsening epigastric pain onset 10am after eating breakfast. Pt reports the pain is sharp in nature, rated at a 9/10 when present.  Associated symptoms include N/V/D.  Pt denies melena or hematochezia; emesis is NBNB.  Pt reports pain lasts for several seconds then resolves, occuring 15 times today.  Pt with cholecystectomy in 2013 and has had this pain intermittently since the surgery. He did not f/u with surgery.  Nothing makes it better and nothing makes it worse.  Pt denies fever, headache, neck pain, chest pain, SOB, weakness, dizziness, syncope, dysuria.      History reviewed. No pertinent past medical history. Past Surgical History  Procedure Laterality Date  . Inguinal hernia repair  age 66  . Cholecystectomy      planned for today  09/18/2011  . Cholecystectomy  09/18/2011    Procedure: LAPAROSCOPIC CHOLECYSTECTOMY WITH INTRAOPERATIVE CHOLANGIOGRAM;  Surgeon: Kandis Cocking, MD;  Location:  [facility] OR;  Service: General;  Laterality: N/A;   History reviewed. No pertinent family history. History  Substance Use Topics  . Smoking status: Never Smoker   . Smokeless tobacco: Not on file  . Alcohol Use: Yes     Comment: on occasion    Review of Systems  Constitutional: Negative for fever, diaphoresis, appetite change, fatigue and unexpected weight change.  HENT: Negative for mouth sores and trouble swallowing.   Respiratory: Negative for cough, chest tightness, shortness of breath, wheezing and stridor.   Cardiovascular: Negative for chest pain and palpitations.  Gastrointestinal: Positive for nausea, vomiting, abdominal pain and diarrhea. Negative for constipation, blood in stool, abdominal distention and rectal pain.  Genitourinary: Negative for dysuria, urgency, frequency, hematuria, flank pain and difficulty urinating.  Musculoskeletal: Negative for back pain, neck pain and neck stiffness.  Skin: Negative for rash.  Neurological: Negative for weakness.  Hematological: Negative for adenopathy.  Psychiatric/Behavioral: Negative for confusion.  All other systems reviewed and are negative.     Allergies  Review of patient's allergies indicates no known allergies.  Home Medications   Prior to Admission medications   Medication Sig Start Date End Date Taking? Authorizing Provider  omeprazole (PRILOSEC) 20 MG capsule Take 1 capsule (20 mg total) by mouth daily before breakfast. 30 minutes before meal 07/05/14   Taym Twist, PA-C  promethazine (PHENERGAN) 25 MG tablet Take 1 tablet (25 mg total) by mouth every 6 (six) hours as needed for nausea or vomiting. 07/05/14   Genola Yuille, PA-C   BP 103/58  Pulse 90  Temp(Src) 98.2 F (36.8  C) (Oral)  Resp 16  Wt 270 lb (122.471 kg)  SpO2 96% Physical Exam  Nursing note and vitals reviewed. Constitutional: He appears well-developed and well-nourished. No distress.  Awake, alert, nontoxic appearance  HENT:  Head:  Normocephalic and atraumatic.  Mouth/Throat: Oropharynx is clear and moist. No oropharyngeal exudate.  Eyes: Conjunctivae are normal. No scleral icterus.  Neck: Normal range of motion. Neck supple.  Cardiovascular: Normal rate, regular rhythm, normal heart sounds and intact distal pulses.   No murmur heard. Pulmonary/Chest: Effort normal and breath sounds normal. No respiratory distress. He has no wheezes.  Equal chest expansion  Abdominal: Soft. Bowel sounds are normal. He exhibits no distension and no mass. There is no hepatosplenomegaly. There is no tenderness. There is no rebound, no guarding and no CVA tenderness.  Musculoskeletal: Normal range of motion. He exhibits no edema.  Neurological: He is alert. He exhibits normal muscle tone. Coordination normal.  Speech is clear and goal oriented Moves extremities without ataxia  Skin: Skin is warm and dry. He is not diaphoretic. No erythema.  Psychiatric: He has a normal mood and affect.    ED Course  Procedures (including critical care time) Labs Review Labs Reviewed  CBC - Abnormal; Notable for the following:    WBC 18.7 (*)    RBC 6.06 (*)    All other components within normal limits  COMPREHENSIVE METABOLIC PANEL - Abnormal; Notable for the following:    Sodium 135 (*)    Total Protein 8.6 (*)    All other components within normal limits  LIPASE, BLOOD  URINALYSIS, ROUTINE W REFLEX MICROSCOPIC    Imaging Review Ct Abdomen Pelvis W Contrast  07/05/2014   CLINICAL DATA:  Epigastric pain with leukocytosis, emesis and diarrhea.  EXAM: CT ABDOMEN AND PELVIS WITH CONTRAST  TECHNIQUE: Multidetector CT imaging of the abdomen and pelvis was performed using the standard protocol following bolus administration of intravenous contrast.  CONTRAST:  50mL OMNIPAQUE IOHEXOL 300 MG/ML SOLN, OMNIPAQUE IOHEXOL 300 MG/ML SOLN  COMPARISON:  Abdominal ultrasound 09/17/2011  FINDINGS: Lung bases are within normal.  Abdominal images demonstrate  evidence of a prior cholecystectomy. The liver, spleen, adrenal glands and pancreas are within normal. Kidneys are normal in size without hydronephrosis or nephrolithiasis. Ureters are normal. The appendix is normal. There is no evidence of bowel obstruction. There is no free fluid or focal inflammatory change. Subtle engorgement of the mesenteric vessels adjacent a couple small bowel loops in the right lower quadrant which are otherwise normal in caliber. There is a slight increased number of small lymph nodes in the right lower quadrant.  Pelvic images demonstrate a normal bladder, prostate and rectum. Remaining bones soft tissues are within normal.  IMPRESSION: No acute findings in the abdomen/pelvis.  Nonspecific subtle engorgement of the mesenteric vessels adjacent a few small bowel loops in the right lower quadrant with slight increase in number of small mesenteric lymph nodes in this region. No dilated bowel loops or free fluid. Although these findings are likely within normal, if patient's symptoms persist consider followup CT scan with attention to this region.  Cholecystectomy.   Electronically Signed   By: Elberta Fortis M.D.   On: 07/05/2014 20:19     EKG Interpretation None      MDM   Final diagnoses:  Epigastric abdominal pain  Nausea vomiting and diarrhea  Gastritis   [name] presents with epigastric pain.  Pt has been seen several times in the past for the  same, but no CT scan on those evaluations.  Will give pai control and check labs.    6:30PM Pt with significant leukocytosis and continues to c/o pain. No elevation in AST/ALT or Lipase; doubt common bile duct obstruction.  No emesis here in the department, but will obtain CT scan to r/o other etiology such as colitis.    9:22 PM Ct scan with Nonspecific subtle engorgement of the mesenteric vessels adjacent a few small bowel loops in the right lower quadrant with slight increase in number of small mesenteric lymph nodes  in this region. No dilated bowel loops or free fluid.   Clinically patient without right lower quadrant abdominal pain. Repeat exam patient is pain-free, tolerating by mouth without difficulty and no further episodes of emesis or diarrhea. No rebound or peritoneal signs.  Patient is nontoxic, nonseptic appearing, in no apparent distress.  Patient's pain and other symptoms adequately managed in emergency department.  Fluid bolus given.  Labs, imaging and vitals reviewed.  Patient does not meet the SIRS or Sepsis criteria.  On repeat exam patient does not have a surgical abdomin and there are no peritoneal signs.  No indication of appendicitis, bowel obstruction, bowel perforation, cholecystitis, diverticulitis.  Patient discharged home with symptomatic treatment and given strict instructions for follow-up with their primary care physician in 3 days.    I have personally reviewed patient's vitals, nursing note and any pertinent labs or imaging.  I performed an undressed physical exam.    It has been determined that no acute conditions requiring further emergency intervention are present at this time. The patient/guardian have been advised of the diagnosis and plan. I reviewed all labs and imaging including any potential incidental findings (engorgement of mesenteric lymph nodes). We have discussed signs and symptoms that warrant return to the ED, such as fevers, intractable vomiting, other concerning symptoms.    Vital signs are stable at discharge.   BP 103/58  Pulse 90  Temp(Src) 98.2 F (36.8 C) (Oral)  Resp 16  Wt 270 lb (122.471 kg)  SpO2 96%  The patient was discussed with Dr. Loretha Stapler who agrees with the treatment plan.            Chad Client Layken Beg, PA-C 07/05/14 2124

## 2014-07-05 NOTE — Discharge Instructions (Signed)
1. Medications: phenergan, omeprazole, usual home medications 2. Treatment: rest, drink plenty of fluids, advance diet slowly 3. Follow Up: Please followup with urgent medical and family care in 3 days for discussion of your diagnoses and further evaluation after today's visit; if you do not have a primary care doctor use the resource guide provided to find one;     Abdominal Pain Many things can cause abdominal pain. Usually, abdominal pain is not caused by a disease and will improve without treatment. It can often be observed and treated at home. Your health care provider will do a physical exam and possibly order blood tests and X-rays to help determine the seriousness of your pain. However, in many cases, more time must pass before a clear cause of the pain can be found. Before that point, your health care provider may not know if you need more testing or further treatment. HOME CARE INSTRUCTIONS  Monitor your abdominal pain for any changes. The following actions may help to alleviate any discomfort you are experiencing:  Only take over-the-counter or prescription medicines as directed by your health care provider.  Do not take laxatives unless directed to do so by your health care provider.  Try a clear liquid diet (broth, tea, or water) as directed by your health care provider. Slowly move to a bland diet as tolerated. SEEK MEDICAL CARE IF:  You have unexplained abdominal pain.  You have abdominal pain associated with nausea or diarrhea.  You have pain when you urinate or have a bowel movement.  You experience abdominal pain that wakes you in the night.  You have abdominal pain that is worsened or improved by eating food.  You have abdominal pain that is worsened with eating fatty foods.  You have a fever. SEEK IMMEDIATE MEDICAL CARE IF:   Your pain does not go away within 2 hours.  You keep throwing up (vomiting).  Your pain is felt only in portions of the abdomen, such as  the right side or the left lower portion of the abdomen.  You pass bloody or black tarry stools. MAKE SURE YOU:  Understand these instructions.   Will watch your condition.   Will get help right away if you are not doing well or get worse.  Document Released: 07/18/2005 Document Revised: 10/13/2013 Document Reviewed: 06/17/2013 [facility] Patient Information 2015 [place], [place]. This information is not intended to replace advice given to you by your health care provider. Make sure you discuss any questions you have with your health care provider.    Emergency Department Resource Guide 1) Find a Doctor and Pay Out of Pocket Although you won't have to find out who is covered by your insurance plan, it is a good idea to ask around and get recommendations. You will then need to call the office and see if the doctor you have chosen will accept you as a new patient and what types of options they offer for patients who are self-pay. Some doctors offer discounts or will set up payment plans for their patients who do not have insurance, but you will need to ask so you aren't surprised when you get to your appointment.  2) Contact Your Local Health Department Not all health departments have doctors that can see patients for sick visits, but many do, so it is worth a call to see if yours does. If you don't know where your local health department is, you can check in your phone book. The CDC also has a tool  to help you locate your state's health department, and many state websites also have listings of all of their local health departments.  3) Find a Walk-in Clinic If your illness is not likely to be very severe or complicated, you may want to try a walk in clinic. These are popping up all over the country in pharmacies, drugstores, and shopping centers. They're usually staffed by nurse practitioners or physician assistants that have been trained to treat common illnesses and complaints. They're  usually fairly quick and inexpensive. However, if you have serious medical issues or chronic medical problems, these are probably not your best option.  No Primary Care Doctor: - Call Health Connect at  [phone] - they can help you locate a primary care doctor that  accepts your insurance, provides certain services, etc. - Physician Referral Service- [phone]  Chronic Pain Problems: Organization         Address  Phone   Notes  [facility]  [phone] Patients need to be referred by their primary care doctor.   Medication Assistance: Organization         Address  Phone   Notes  [facility] Medication [facility] [address] [place] [place], [place] [address] [phone] --Must be a resident of [facility] -- Must have NO insurance coverage whatsoever (no Medicaid/ Medicare, etc.) -- The pt. MUST have a primary care doctor that directs their care regularly and follows them in the community   [facility]  [phone]   [employer]  [phone]    Agencies that provide inexpensive medical care: Organization         Address  Phone   Notes  [facility]  [phone]   [facility]    [phone]   [facility] [address] [place], [place] [address] [phone]   [facility] [address], [place] [phone]   [facility]anned Parenthood    [phone]   [facility]    [phone]   [facility]  [address], [place] Phone:  [phone], Fax:  [phone] Hours of Operation:  9 am - 6 pm, M-F.  Also accepts Medicaid/Medicare and self-pay.  [facility]  [address], [place] Phone: [phone], Fax: [phone]. Hours of Operation:  8:30 am - 5:30 pm, M-F.  Also accepts Medicaid and self-pay.  [facility] [place] [address] [place] Phone:  [phone]   [facility] [address] Natasha [place]ence [place], [place] [phone], Ext. [phone] Mondays & Thursdays: 7-9 AM.  First 15 patients are seen on a first come, first serve basis.    Medicaid-accepting [facility] Providers:  Organization         Address  Phone   Notes  [facility] [address], [place] [phone] Also accepts self-pay patients.  [facility] [address] Laurell Josephs [place], [place]  [phone]   [facility] [address], [place] [phone]   [facility] [address], [place] [phone]   Renaye Rakers [address], [place]   [phone] Only accepts [place] Access [place] patients after they have their name applied to their card.   Self-Pay (no insurance) in [place]:  Organization         Address  Phone   Notes  Sickle Cell Patients, [facility] [address] [place], [place] [phone]   [facility] [address] [place], [place] [phone]   [facility] [place]  [address], [place] [phone]   [facility]/Dr. Osei-Bonsu  [address] [place] or [address], [place] [phone] Phone number for both [place] and [place] locations is the same.  [facility] [address] [place] [phone]   [facility] [address] [place] or [address] [phone]   [facility] [address], [place] [phone], phone; [phone], fax Sees patients 1st and 3rd Saturday of every month.  Must not qualify for public or private insurance (i.e. Medicaid, Medicare, [place] Health Choice, Veterans' Benefits)  Household income should be no more than 200% of the poverty level The clinic cannot treat  you if you are pregnant or think you are pregnant  Sexually transmitted diseases are not treated at the clinic.    Dental Care: Organization         Address  Phone  Notes  [facility] Department of [facility] [address] [place], [place] [phone] Accepts children up to age 31 who are enrolled in [place] or [place] Health Choice; pregnant women with a Medicaid card; and children who have applied for Medicaid or [place] Health Choice, but were declined, whose parents can pay a reduced fee at time of service.  [facility] Department of [facility]  [address], [place] [phone] Accepts children up to age 25 who are enrolled in [place] or [place] Health Choice; pregnant women with a Medicaid card; and children who have applied for Medicaid or [place] Health Choice, but were declined, whose parents can pay a reduced fee at time of service.  [facility]  [address] [place], [place] [phone] Patients are seen by appointment only. Walk-ins are not accepted. [facility] will see patients 21 years of age and older. Monday - Tuesday (8am-5pm) Most Wednesdays (8:30-5pm) $30 per visit, cash only  [facility]  [address], [facility] [phone] Patients are seen by appointment only. Walk-ins are not accepted. [facility] will see patients 80 years of age and older. One Wednesday Evening (Monthly: Volunteer Based).  $30 per visit, cash only  [employer]  [phone] for adults; Children under age 41, call Graduate Pediatric Dentistry at [phone]. Children aged 63-14, please call [phone] to request a pediatric application.  Dental services are provided in all areas of dental care including fillings, crowns and bridges, complete and partial dentures, implants, gum treatment, root canals, and extractions. Preventive care is also provided. Treatment  is provided to both adults and children. Patients are selected via a lottery and there is often a waiting list.   [facility] [address] [place]  [phone] [email or web address]   [facility] [address], [place] [phone], Ext. [phone] Second and Fourth Thursday of each month, opens at 6:30 AM; Clinic ends at 9 AM.  Patients are seen on a first-come first-served basis, and a limited number are seen during each clinic.  [facility]  [address] Ether Griffins [place], [place] [phone]   Eligibility Requirements You must have lived in [place], [place], or [place] counties for at least the last three months.   You cannot be eligible for state or federal sponsored [place], including [employer], [place], or [employer].   You generally cannot be eligible for healthcare insurance through your employer.    How to apply: Eligibility screenings are held every Tuesday and Wednesday afternoon from 1:00 pm until 4:00 pm. You do not need an appointment for the interview!  [facility] [address], [place], [place] [phone]   [facility] Health Department  [phone]   [facility] Health Department  [phone]   [facility] Health Department  [phone]    Behavioral Health Resources in the Community: Intensive Outpatient Programs Organization         Address  Phone  Notes  [facility] [address] [place], [place] [phone]   [facility] Outpatient [address], [place], [place] [phone]   [facility] [address] [place], [place]  [phone]   [facility] Mental Health [address],  [place], [place] [phone] or [phone]   Substance Abuse Resources Organization         Address  Phone  Notes  [facility]  [phone]   [facility]  [phone]   [facility]  [place]  [phone]   Floydene Flock  [phone]   Residential & Outpatient Substance Abuse Program  [phone]   Psychological Services Organization         Address  Phone  Notes  [facility] Behavioral Health  [phone]   [facility]  [phone]   [facility] [address], [place] [phone] or [phone]    Mobile Crisis Teams Organization         Address  Phone  Notes  [facility], Mobile Crisis Care Unit  [phone]   [facility]  [address] [place], [place] [phone]   Doristine Locks [address] [place] [phone]    Self-Help/Support Groups Organization         Address  Phone             Notes  [facility] [place] - variety of support groups  336- I7437963 Call for more information  Narcotics Anonymous (NA), [facility] [address], [employer] [place]  2 meetings at this location   Statistician         Address  Phone  Notes  [facility] [address],    [place]  [phone]   [facility]  [address] [place] [address], [place], [place] [phone]   [facility] [address] [place], [place] [phone] Admissions: 8am-3pm M-F  [facility] [address],    [place], [place] [phone]   [facility] [address] Starling Manns [place], [place] [phone]   [facility] [address]  [place], [place] [phone]   [facility] - Intensive Outpatient [address] Laurell Josephs [address], [place], [place] [phone]   [facility] (Addiction Recovery Care [facility] [address] [place]  [place], [place] [phone] or [phone]   [facility] ([facility]) [address] [place], [place] [phone] Accepts Medicaid  [facility] [address],  [place] [phone] Substance Abuse/Addiction Treatment  [facility] Organization         Address  Phone  Notes  [facility]  [phone]   Angie Fava, PhD [address] [place]vin Knack [place], [place]   [phone] or [phone]   [facility] Behavioral   [address] [place], [place] [phone]   [facility] Recovery [address], [place], [place] [phone] Insurance/Medicaid/sponsorship through [facility] and Families [address]                                    [place], [place] [phone] Therapy/tele-psych/case  [facility] [address], [place] [phone]    Dr. Lolly Mustache  [phone]   [facility]  [employer] [facility] Dept. 1) [address], [place] 2) [address], [place] 3)  [address], [place] [phone]  [phone]   [facility] Child Abuse Hotline 9188381148 or [phone] (After Hours)

## 2014-07-05 NOTE — ED Notes (Signed)
Patient transported to CT 

## 2014-07-05 NOTE — ED Notes (Signed)
Pt c/o intermittent upper abdominal pain and n/v/d starting this morning.  Pain score 8/10.  Pt reports having this pain intermittently, since having gallbladder removed in 2013.

## 2014-07-09 NOTE — ED Provider Notes (Signed)
Medical screening examination/treatment/procedure(s) were performed by non-physician practitioner and as supervising physician I was immediately available for consultation/collaboration.   EKG Interpretation None        Candyce Churn III, MD 07/09/14 1538

## 2015-07-17 ENCOUNTER — Emergency Department (HOSPITAL_COMMUNITY): Payer: No Typology Code available for payment source

## 2015-07-17 ENCOUNTER — Emergency Department (HOSPITAL_COMMUNITY)
Admission: EM | Admit: 2015-07-17 | Discharge: 2015-07-17 | Disposition: A | Discharge status: Home / Self Care | Payer: Self-pay | Attending: Emergency Medicine | Admitting: Emergency Medicine

## 2015-07-17 ENCOUNTER — Encounter (HOSPITAL_COMMUNITY): Payer: Self-pay | Admitting: Nurse Practitioner

## 2015-07-17 DIAGNOSIS — K088 Other specified disorders of teeth and supporting structures: Secondary | ICD-10-CM | POA: Insufficient documentation

## 2015-07-17 DIAGNOSIS — Z79899 Other long term (current) drug therapy: Secondary | ICD-10-CM | POA: Insufficient documentation

## 2015-07-17 DIAGNOSIS — J029 Acute pharyngitis, unspecified: Secondary | ICD-10-CM | POA: Insufficient documentation

## 2015-07-17 LAB — CBC WITH DIFFERENTIAL/PLATELET
Basophils Absolute: 0 10*3/uL (ref 0.0–0.1)
Basophils Relative: 0 %
Eosinophils Absolute: 0 10*3/uL (ref 0.0–0.7)
Eosinophils Relative: 0 %
HCT: 43.6 % (ref 39.0–52.0)
Hemoglobin: 14.8 g/dL (ref 13.0–17.0)
Lymphocytes Relative: 10 %
Lymphs Abs: 1.3 10*3/uL (ref 0.7–4.0)
MCH: 27.2 pg (ref 26.0–34.0)
MCHC: 33.9 g/dL (ref 30.0–36.0)
MCV: 80.1 fL (ref 78.0–100.0)
Monocytes Absolute: 1.7 10*3/uL — ABNORMAL HIGH (ref 0.1–1.0)
Monocytes Relative: 13 %
Neutro Abs: 9.8 10*3/uL — ABNORMAL HIGH (ref 1.7–7.7)
Neutrophils Relative %: 77 %
Platelets: 256 10*3/uL (ref 150–400)
RBC: 5.44 MIL/uL (ref 4.22–5.81)
RDW: 15.4 % (ref 11.5–15.5)
WBC: 12.8 10*3/uL — ABNORMAL HIGH (ref 4.0–10.5)

## 2015-07-17 LAB — BASIC METABOLIC PANEL
Anion gap: 8 (ref 5–15)
BUN: 8 mg/dL (ref 6–20)
CO2: 28 mmol/L (ref 22–32)
Calcium: 8.8 mg/dL — ABNORMAL LOW (ref 8.9–10.3)
Chloride: 103 mmol/L (ref 101–111)
Creatinine, Ser: 1.11 mg/dL (ref 0.61–1.24)
GFR calc Af Amer: >60 mL/min (ref >60)
GFR calc non Af Amer: >60 mL/min (ref >60)
Glucose, Bld: 106 mg/dL — ABNORMAL HIGH (ref 65–99)
Potassium: 4.9 mmol/L (ref 3.5–5.1)
Sodium: 139 mmol/L (ref 135–145)

## 2015-07-17 LAB — RAPID STREP SCREEN (MED CTR MEBANE ONLY): Streptococcus, Group A Screen (Direct): NEGATIVE

## 2015-07-17 MED ORDER — HYDROCODONE-ACETAMINOPHEN 5-325 MG PO TABS
2.0000 | ORAL_TABLET | Freq: Once | ORAL | Status: AC
  Administered 2015-07-17: 2 via ORAL
  Filled 2015-07-17: qty 2

## 2015-07-17 MED ORDER — SODIUM CHLORIDE 0.9 % IV BOLUS (SEPSIS)
1000.0000 mL | Freq: Once | INTRAVENOUS | Status: AC
  Administered 2015-07-17: 1000 mL via INTRAVENOUS

## 2015-07-17 MED ORDER — KETOROLAC TROMETHAMINE 30 MG/ML IJ SOLN
30.0000 mg | Freq: Once | INTRAMUSCULAR | Status: AC
  Administered 2015-07-17: 30 mg via INTRAVENOUS
  Filled 2015-07-17: qty 1

## 2015-07-17 MED ORDER — IOHEXOL 300 MG/ML  SOLN: 75.0000 mL | Freq: Once | INTRAMUSCULAR | Status: DC | PRN

## 2015-07-17 NOTE — Discharge Instructions (Signed)
°  You were evaluated in the ED today for sore throat. There does not appear to be an emergent cause for your symptoms at this time. Your exam, labs and CT scan of her neck are all reassuring. Please continue to take Motrin as needed for your discomfort. He may also use salt water gargles. Follow-up with your doctor as needed. Return to ED for worsening symptoms.  Salt Water Gargle This solution will help make your mouth and throat feel better. HOME CARE INSTRUCTIONS   Mix 1 teaspoon of salt in 8 ounces of warm water.  Gargle with this solution as much or often as you need or as directed. Swish and gargle gently if you have any sores or wounds in your mouth.  Do not swallow this mixture. Document Released: 07/12/2004 Document Revised: 12/31/2011 Document Reviewed: 12/03/2008 [facility] Patient Information 2015 [place], [place]. This information is not intended to replace advice given to you by your health care provider. Make sure you discuss any questions you have with your health care provider.  Sore Throat A sore throat is pain, burning, irritation, or scratchiness of the throat. There is often pain or tenderness when swallowing or talking. A sore throat may be accompanied by other symptoms, such as coughing, sneezing, fever, and swollen neck glands. A sore throat is often the first sign of another sickness, such as a cold, flu, strep throat, or mononucleosis (commonly known as mono). Most sore throats go away without medical treatment. CAUSES  The most common causes of a sore throat include:  A viral infection, such as a cold, flu, or mono.  A bacterial infection, such as strep throat, tonsillitis, or whooping cough.  Seasonal allergies.  Dryness in the air.  Irritants, such as smoke or pollution.  Gastroesophageal reflux disease (GERD). HOME CARE INSTRUCTIONS   Only take over-the-counter medicines as directed by your caregiver.  Drink enough fluids to keep your urine clear or pale  yellow.  Rest as needed.  Try using throat sprays, lozenges, or sucking on hard candy to ease any pain (if older than 4 years or as directed).  Sip warm liquids, such as broth, herbal tea, or warm water with honey to relieve pain temporarily. You may also eat or drink cold or frozen liquids such as frozen ice pops.  Gargle with salt water (mix 1 tsp salt with 8 oz of water).  Do not smoke and avoid secondhand smoke.  Put a cool-mist humidifier in your bedroom at night to moisten the air. You can also turn on a hot shower and sit in the bathroom with the door closed for 5-10 minutes. SEEK IMMEDIATE MEDICAL CARE IF:  You have difficulty breathing.  You are unable to swallow fluids, soft foods, or your saliva.  You have increased swelling in the throat.  Your sore throat does not get better in 7 days.  You have nausea and vomiting.  You have a fever or persistent symptoms for more than 2-3 days.  You have a fever and your symptoms suddenly get worse. MAKE SURE YOU:   Understand these instructions.  Will watch your condition.  Will get help right away if you are not doing well or get worse. Document Released: 11/15/2004 Document Revised: 09/24/2012 Document Reviewed: 06/15/2012 [facility] Patient Information 2015 [place], [place]. This information is not intended to replace advice given to you by your health care provider. Make sure you discuss any questions you have with your health care provider.

## 2015-07-17 NOTE — ED Notes (Signed)
He c/o swollen painful gums, nasal congestion and drainage and fevers since Thursday. He has been taking motrin with relief of fevers but not pain

## 2015-07-17 NOTE — ED Provider Notes (Signed)
CSN: [id number]     Arrival date & time 07/17/15  1129 History   First MD Initiated Contact with Patient 07/17/15 1204     Chief Complaint  Patient presents with  . Dental Pain     (Consider location/radiation/quality/duration/timing/severity/associated sxs/prior Treatment) HPI [name] is a 29 y.o. male who comes in for evaluation of sore throat. Patient is accompanied by fiance who contributes to history of present illness. Patient states for the past 3 days he has had a worsening sore throat with associated left-sided neck pain. He reports fevers of 103F at home, for which he took Motrin and the fever resolved. He also reports productive cough with yellow mucus over the last 3 days. Fiance does report a mild change in phonation characterizing it as "squeakly". Patient also reports that he has increased saliva production at night and vitals have waking up drooling on himself and will be able to fill up water bottles at the bedside with the saliva. Denies trismus or inability to swallow. Rates discomfort now as an 8/10. No other aggravating or modifying factors.  History reviewed. No pertinent past medical history. Past Surgical History  Procedure Laterality Date  . Inguinal hernia repair  age 82  . Cholecystectomy      planned for today  09/18/2011  . Cholecystectomy  09/18/2011    Procedure: LAPAROSCOPIC CHOLECYSTECTOMY WITH INTRAOPERATIVE CHOLANGIOGRAM;  Surgeon: Kandis Cocking, MD;  Location: [facility] OR;  Service: General;  Laterality: N/A;   History reviewed. No pertinent family history. Social History  Substance Use Topics  . Smoking status: Never Smoker   . Smokeless tobacco: None  . Alcohol Use: Yes     Comment: on occasion    Review of Systems A 10 point review of systems was completed and was negative except for pertinent positives and negatives as mentioned in the history of present illness     Allergies  Review of patient's allergies indicates no known  allergies.  Home Medications   Prior to Admission medications   Medication Sig Start Date End Date Taking? Authorizing Provider  omeprazole (PRILOSEC) 20 MG capsule Take 1 capsule (20 mg total) by mouth daily before breakfast. 30 minutes before meal 07/05/14   Hannah Muthersbaugh, PA-C  promethazine (PHENERGAN) 25 MG tablet Take 1 tablet (25 mg total) by mouth every 6 (six) hours as needed for nausea or vomiting. 07/05/14   Hannah Muthersbaugh, PA-C   BP 107/61 mmHg  Pulse 99  Temp(Src) 100 F (37.8 C) (Oral)  Resp 18  SpO2 96% Physical Exam  Constitutional: He is oriented to person, place, and time. He appears well-developed and well-nourished.  HENT:  Head: Normocephalic and atraumatic.  Mouth/Throat: Oropharynx is clear and moist.  Oropharynx appears to be clear and moist with no unilateral tonsillar swelling. Uvula is midline. No trismus. Tolerating secretions well in the ED. No obvious abnormal phonation. No glossal elevation or evidence of subglossal infection.  Eyes: Conjunctivae are normal. Pupils are equal, round, and reactive to light. Right eye exhibits no discharge. Left eye exhibits no discharge. No scleral icterus.  Neck: Neck supple.  Tenderness to palpation throughout left anterior cervical chain.  Cardiovascular: Normal rate, regular rhythm and normal heart sounds.   Pulmonary/Chest: Effort normal and breath sounds normal. No respiratory distress. He has no wheezes. He has no rales.  Abdominal: Soft. There is no tenderness.  Musculoskeletal: He exhibits no tenderness.  Neurological: He is alert and oriented to person, place, and time.  Cranial Nerves II-XII  grossly intact  Skin: Skin is warm and dry. No rash noted.  Psychiatric: He has a normal mood and affect.  Nursing note and vitals reviewed.   ED Course  Procedures (including critical care time) Labs Review Labs Reviewed  BASIC METABOLIC PANEL - Abnormal; Notable for the following:    Glucose, Bld 106 (*)     Calcium 8.8 (*)    All other components within normal limits  CBC WITH DIFFERENTIAL/PLATELET - Abnormal; Notable for the following:    WBC 12.8 (*)    Neutro Abs 9.8 (*)    Monocytes Absolute 1.7 (*)    All other components within normal limits  RAPID STREP SCREEN (NOT AT [facility])  CULTURE, GROUP A STREP    Imaging Review Ct Soft Tissue Neck W Contrast  07/17/2015   CLINICAL DATA:  Bilateral neck swelling and pain for 3 days. Dysphagia. Initial encounter.  EXAM: CT NECK WITH CONTRAST  TECHNIQUE: Multidetector CT imaging of the neck was performed using the standard protocol following the bolus administration of intravenous contrast.  CONTRAST:  75 ml Omnipaque 300.  COMPARISON:  Head CT 12/21/2011.  Maxillofacial CT 02/09/2010.  FINDINGS: Pharynx and larynx: The larynx and epiglottis appear normal. Prominence of the nasopharyngeal soft tissues and uvula is chronic and slightly improved compared with the prior study. The prevertebral soft tissues otherwise appear normal. No evidence of airway compromise or focal inflammation.  Salivary glands: Unremarkable.  Thyroid: Normal in appearance.  Lymph nodes: There are several mildly enlarged level 2 and 3 lymph nodes bilaterally. These measure up to 1.5 cm short axis on the right (image 52) and 2.0 cm on the left (image 51 of series 205). The prominence of these nodes has increased compared with the prior study.  Vascular: No arterial or venous abnormalities identified.  Limited intracranial: The visualized intracranial contents are unremarkable.  Visualized orbits: The orbits appear normal without inflammatory changes.  Mastoids and visualized paranasal sinuses: Clear.  Skeleton: Unremarkable.  Upper chest: The lung apices are clear. No superior mediastinal lymphadenopathy identified.  IMPRESSION: 1. No inflammatory changes, airway compromise or clear explanation for the patient's symptoms identified. 2. Upper cervical lymphadenopathy is nonspecific but likely  reactive. Clinical follow up recommended. 3. Chronic prominence of the nasopharyngeal soft tissues and uvula.   Electronically Signed   By: Carey Bullocks M.D.   On: 07/17/2015 15:36   I have personally reviewed and evaluated these images and lab results as part of my medical decision-making.   EKG Interpretation None     Meds given in ED:  Medications  iohexol (OMNIPAQUE) 300 MG/ML solution 75 mL (not administered)  ketorolac (TORADOL) 30 MG/ML injection 30 mg (not administered)  HYDROcodone-acetaminophen (NORCO/VICODIN) 5-325 MG per tablet 2 tablet (2 tablets Oral Given 07/17/15 1305)  sodium chloride 0.9 % bolus 1,000 mL (0 mLs Intravenous Stopped 07/17/15 1456)    New Prescriptions   No medications on file   Filed Vitals:   07/17/15 1404 07/17/15 1415 07/17/15 1454 07/17/15 1500  BP: 123/66 137/64 116/49 107/61  Pulse: 103 106 106 99  Temp: 100 F (37.8 C)     TempSrc:      Resp: 18  18   SpO2: 97% 96% 96% 96%    MDM  Vitals stable  -afebrile Pt resting comfortably in ED. PE--physical exam as above. No obvious intraoral pathology. Left-sided anterior cervical discomfort. Tolerating secretions well. However, due to patient report of change in voice, drooling, fever, will obtain CT soft tissue  neck to evaluate for possible deep space infection. Given Norco for pain, Toradol to help with inflammation as well as discomfort. CT soft tissue neck shows no excellent pronation for patient sore throat or adenopathy. Suggest adenopathy is likely reactive. No evidence of deep space infection.  Upon reevaluation at 15:45, patient appears well, is eating and drinking in the ED without difficulty. Suspect symptoms likely due to viral etiology. Encouraged continued use of Motrin at home as well as symptomatic care with salt water gargles. I discussed all relevant lab findings and imaging results with pt and they verbalized understanding. Discussed f/u with PCP within 48 hrs and return  precautions, pt very amenable to plan.  Final diagnoses:  Sore throat        Joycie Peek, PA-C 07/17/15 1610  Alvira Monday, MD 07/19/15 [phone]

## 2015-07-17 NOTE — ED Notes (Signed)
Pt eating [place] sandwich and drinking ginger ale. Tolerating it well.

## 2015-07-19 ENCOUNTER — Emergency Department (HOSPITAL_COMMUNITY)
Admission: EM | Admit: 2015-07-19 | Discharge: 2015-07-19 | Disposition: A | Discharge status: Home / Self Care | Payer: Self-pay | Attending: Emergency Medicine | Admitting: Emergency Medicine

## 2015-07-19 ENCOUNTER — Encounter (HOSPITAL_COMMUNITY): Payer: Self-pay | Admitting: Emergency Medicine

## 2015-07-19 DIAGNOSIS — R0981 Nasal congestion: Secondary | ICD-10-CM | POA: Insufficient documentation

## 2015-07-19 DIAGNOSIS — K120 Recurrent oral aphthae: Secondary | ICD-10-CM | POA: Insufficient documentation

## 2015-07-19 LAB — CULTURE, GROUP A STREP: Strep A Culture: NEGATIVE

## 2015-07-19 MED ORDER — MAGIC MOUTHWASH W/LIDOCAINE: 5.0000 mL | Freq: Four times a day (QID) | ORAL | Status: DC | PRN

## 2015-07-19 NOTE — ED Provider Notes (Signed)
CSN: [id number]     Arrival date & time 07/19/15  0349 History   First MD Initiated Contact with Patient 07/19/15 0502     Chief Complaint  Patient presents with  . Oral Swelling     (Consider location/radiation/quality/duration/timing/severity/associated sxs/prior Treatment) HPI Comments: Complains of multiple painful sores on his tongue and cheeks causing difficulty with swallowing secretions. He was seen and evaluated on 07/17/15 for sore throat that he states has improved. No interim fever. He denies cough, but has had some nasal congestion. He had nausea with vomiting last week but none currently.   The history is provided by the patient. No language interpreter was used.    History reviewed. No pertinent past medical history. Past Surgical History  Procedure Laterality Date  . Inguinal hernia repair  age 29  . Cholecystectomy      planned for today  09/18/2011  . Cholecystectomy  09/18/2011    Procedure: LAPAROSCOPIC CHOLECYSTECTOMY WITH INTRAOPERATIVE CHOLANGIOGRAM;  Surgeon: Kandis Cocking, MD;  Location: [facility] OR;  Service: General;  Laterality: N/A;   No family history on file. Social History  Substance Use Topics  . Smoking status: Never Smoker   . Smokeless tobacco: None  . Alcohol Use: Yes     Comment: on occasion    Review of Systems  Constitutional: Negative for fever.  HENT: Positive for congestion. Negative for rhinorrhea and sore throat.   Gastrointestinal: Negative for nausea and vomiting.  Musculoskeletal: Negative for neck stiffness.  Skin: Negative for rash.      Allergies  Review of patient's allergies indicates no known allergies.  Home Medications   Prior to Admission medications   Medication Sig Start Date End Date Taking? Authorizing Provider  acetaminophen (TYLENOL) 325 MG tablet Take 650 mg by mouth every 6 (six) hours as needed for mild pain.   Yes Historical Provider, MD  ibuprofen (ADVIL,MOTRIN) 200 MG tablet Take 200 mg by mouth every 6  (six) hours as needed for mild pain.   Yes Historical Provider, MD  omeprazole (PRILOSEC) 20 MG capsule Take 1 capsule (20 mg total) by mouth daily before breakfast. 30 minutes before meal Patient not taking: Reported on 07/19/2015 07/05/14   Dahlia Client Muthersbaugh, PA-C  promethazine (PHENERGAN) 25 MG tablet Take 1 tablet (25 mg total) by mouth every 6 (six) hours as needed for nausea or vomiting. Patient not taking: Reported on 07/19/2015 07/05/14   Dahlia Client Muthersbaugh, PA-C   BP 133/79 mmHg  Pulse 90  Temp(Src) 98.8 F (37.1 C) (Oral)  Resp 17  Ht  (1.727 m)  Wt 337 lb (152.862 kg)  BMI 51.25 kg/m2  SpO2 96% Physical Exam  Constitutional: He is oriented to person, place, and time. He appears well-developed and well-nourished. No distress.  HENT:  Head: Normocephalic.  Mouth/Throat: Oropharynx is clear and moist.  Multiple shallow ulcerations on tongue and buccal mucosa. No bleeding, swelling. Oropharynx benign with midline uvula.  Neck: Normal range of motion. Neck supple.  Cardiovascular: Normal rate.   Pulmonary/Chest: Effort normal.  Musculoskeletal: Normal range of motion.  Lymphadenopathy:    He has no cervical adenopathy.  Neurological: He is alert and oriented to person, place, and time.  Skin: Skin is warm and dry.    ED Course  Procedures (including critical care time) Labs Review Labs Reviewed - No data to display  Imaging Review Ct Soft Tissue Neck W Contrast  07/17/2015   CLINICAL DATA:  Bilateral neck swelling and pain for 3 days. Dysphagia. Initial  encounter.  EXAM: CT NECK WITH CONTRAST  TECHNIQUE: Multidetector CT imaging of the neck was performed using the standard protocol following the bolus administration of intravenous contrast.  CONTRAST:  75 ml Omnipaque 300.  COMPARISON:  Head CT 12/21/2011.  Maxillofacial CT 02/09/2010.  FINDINGS: Pharynx and larynx: The larynx and epiglottis appear normal. Prominence of the nasopharyngeal soft tissues and uvula is  chronic and slightly improved compared with the prior study. The prevertebral soft tissues otherwise appear normal. No evidence of airway compromise or focal inflammation.  Salivary glands: Unremarkable.  Thyroid: Normal in appearance.  Lymph nodes: There are several mildly enlarged level 2 and 3 lymph nodes bilaterally. These measure up to 1.5 cm short axis on the right (image 52) and 2.0 cm on the left (image 51 of series 205). The prominence of these nodes has increased compared with the prior study.  Vascular: No arterial or venous abnormalities identified.  Limited intracranial: The visualized intracranial contents are unremarkable.  Visualized orbits: The orbits appear normal without inflammatory changes.  Mastoids and visualized paranasal sinuses: Clear.  Skeleton: Unremarkable.  Upper chest: The lung apices are clear. No superior mediastinal lymphadenopathy identified.  IMPRESSION: 1. No inflammatory changes, airway compromise or clear explanation for the patient's symptoms identified. 2. Upper cervical lymphadenopathy is nonspecific but likely reactive. Clinical follow up recommended. 3. Chronic prominence of the nasopharyngeal soft tissues and uvula.   Electronically Signed   By: Carey Bullocks M.D.   On: 07/17/2015 15:36   I have personally reviewed and evaluated these images and lab results as part of my medical decision-making.   EKG Interpretation None      MDM   Final diagnoses:  None    1. Aphthous ulcers  Magic Mouthwash provided. Encouraged PCP follow up - resources provided.    Elpidio Anis, PA-C 07/19/15 0540  Azalia Bilis, MD 07/19/15 [phone]

## 2015-07-19 NOTE — ED Notes (Signed)
Pt. reports painful sores at tip of tongue and gums with nausea and emesis  onset last week , denies injury , no fever / respirations unlabored .

## 2015-07-19 NOTE — Discharge Instructions (Signed)
Oral Ulcers Oral ulcers are painful, shallow sores around the lining of the mouth. They can affect the gums, the inside of the lips, and the cheeks. (Sores on the outside of the lips and on the face are different.) They typically first occur in school-aged children and teenagers. Oral ulcers may also be called canker sores or cold sores. CAUSES  Canker sores and cold sores can be caused by many factors including:  Infection.  Injury.  Sun exposure.  Medications.  Emotional stress.  Food allergies.  Vitamin deficiencies.  Toothpastes containing sodium lauryl sulfate. The herpes virus can be the cause of mouth ulcers. The first infection can be severe and cause 10 or more ulcers on the gums, tongue, and lips with fever and difficulty in swallowing. This infection usually occurs between the ages of 75 and 3 years.  SYMPTOMS  The typical sore is about  inch (6 mm) in size and is an oval or round ulcer with red borders. DIAGNOSIS  Your caregiver can diagnose simple oral ulcers by examination. Additional testing is usually not required.  TREATMENT  Treatment is aimed at pain relief. Generally, oral ulcers resolve by themselves within 1 to 2 weeks without medication and are not contagious unless caused by herpes (and other viruses). Antibiotics are not effective with mouth sores. Avoid direct contact with others until the ulcer is completely healed. See your caregiver for follow-up care as recommended. Also:  Offer a soft diet.  Encourage plenty of fluids to prevent dehydration. Popsicles and milk shakes can be helpful.  Avoid acidic and salty foods and drinks such as orange juice.  Infants and young children will often refuse to drink because of pain. Using a teaspoon, cup, or syringe to give small amounts of fluids frequently can help prevent dehydration.  Cold compresses on the face may help reduce pain.  Pain medication can help control soreness.  A solution of diphenhydramine  mixed with a liquid antacid can be useful to decrease the soreness of ulcers. Consult a caregiver for the dosing.  Liquids or ointments with a numbing ingredient may be helpful when used as recommended.  Older children and teenagers can rinse their mouth with a salt-water mixture (1/2 teaspoon of salt in 8 ounces of water) four times a day. This treatment is uncomfortable but may reduce the time the ulcers are present.  There are many over-the-counter throat lozenges and medications available for oral ulcers. Their effectiveness has not been studied.  Consult your medical caregiver prior to using homeopathic treatments for oral ulcers. SEEK MEDICAL CARE IF:   You think your child needs to be seen.  The pain worsens and you cannot control it.  There are 4 or more ulcers.  The lips and gums begin to bleed and crust.  A single mouth ulcer is near a tooth that is causing a toothache or pain.  Your child has a fever, swollen face, or swollen glands.  The ulcers began after starting a medication.  Mouth ulcers keep reoccurring or last more than 2 weeks.  You think your child is not taking adequate fluids. SEEK IMMEDIATE MEDICAL CARE IF:   Your child has a high fever.  Your child is unable to swallow or becomes dehydrated.  Your child looks or acts very ill.  An ulcer caused by a chemical your child accidentally put in their mouth. Document Released: 11/15/2004 Document Revised: 02/22/2014 Document Reviewed: 06/30/2009 [facility] Patient Information 2015 [place], [place]. This information is not intended to replace advice  given to you by your health care provider. Make sure you discuss any questions you have with your health care provider.  Emergency Department Resource Guide 1) Find a Doctor and Pay Out of Pocket Although you won't have to find out who is covered by your insurance plan, it is a good idea to ask around and get recommendations. You will then need to call the office and  see if the doctor you have chosen will accept you as a new patient and what types of options they offer for patients who are self-pay. Some doctors offer discounts or will set up payment plans for their patients who do not have insurance, but you will need to ask so you aren't surprised when you get to your appointment.  2) Contact Your Local Health Department Not all health departments have doctors that can see patients for sick visits, but many do, so it is worth a call to see if yours does. If you don't know where your local health department is, you can check in your phone book. The CDC also has a tool to help you locate your state's health department, and many state websites also have listings of all of their local health departments.  3) Find a Walk-in Clinic If your illness is not likely to be very severe or complicated, you may want to try a walk in clinic. These are popping up all over the country in pharmacies, drugstores, and shopping centers. They're usually staffed by nurse practitioners or physician assistants that have been trained to treat common illnesses and complaints. They're usually fairly quick and inexpensive. However, if you have serious medical issues or chronic medical problems, these are probably not your best option.  No Primary Care Doctor: - Call Health Connect at  [phone] - they can help you locate a primary care doctor that  accepts your insurance, provides certain services, etc. - Physician Referral Service- [phone]  Chronic Pain Problems: Organization         Address  Phone   Notes  [facility]  [phone] Patients need to be referred by their primary care doctor.   Medication Assistance: Organization         Address  Phone   Notes  [facility] [address] [place], [place] [address] [phone] --Must be a resident of [facility] -- Must have NO insurance coverage whatsoever  (no Medicaid/ Medicare, etc.) -- The pt. MUST have a primary care doctor that directs their care regularly and follows them in the community   [facility]  [phone]   [employer]  [phone]    Agencies that provide inexpensive medical care: Organization         Address  Phone   Notes  [facility]  [phone]   [facility]    [phone]   [facility] [address] [place], [place] [address] [phone]   [facility] [address], [place] [phone]   Planned Parenthood    [phone]   [facility]    [phone]   [facility]  [address], [place] Phone:  [phone], Fax:  [phone] Hours of Operation:  9 am - 6 pm, M-F.  Also accepts Medicaid/Medicare and self-pay.  [facility]  [address], [place] Phone: [phone], Fax: [phone]. Hours of Operation:  8:30 am - 5:30 pm, M-F.  Also accepts Medicaid and self-pay.  [facility] [place] [address], [place] Phone: [phone]   [facility] [address] Natasha Bence [place], [place] [phone], Ext. [phone] Mondays & Thursdays: 7-9 AM.  First 15 patients are seen on a first come, first serve basis.    Medicaid-accepting [facility] Providers:  Organization         Address  Phone   Notes  [facility] [address], [place] [phone] Also accepts self-pay patients.  [facility] [address] Laurell Josephs [place], [place]  [phone]   [facility] [address], [place] [phone]   [facility] [address] [place] [phone]   Renaye Rakers [address], [place]   [phone] Only accepts [place] Access [place] patients after they have their name applied to their  card.   Self-Pay (no insurance) in [facility]:  Organization         Address  Phone   Notes  Sickle Cell Patients, [facility] Internal Medicine [address] [place], [place] [phone]   [facility] [address] [place], [place] [phone]   [facility] [place]  [address], [place] [phone]   [facility]/Dr. Osei-Bonsu  [address] [place] or [address], [place] [phone] Phone number for both [place] and [place] locations is the same.  [facility] [address] [place] [phone]   [facility] [address], [place] or [address] [phone]   [facility] [address] [place] [phone], phone; [phone], fax Sees patients 1st and 3rd Saturday of every month.  Must not qualify for public or private insurance (i.e. Medicaid, Medicare, [place] Health Choice, Veterans' Benefits)  Household income should be no more than 200% of the poverty level The clinic cannot treat you if you are pregnant or think you are pregnant  Sexually transmitted diseases are not treated at the clinic.    Dental Care: Organization         Address  Phone  Notes  [facility] Department of [facility] [address] [place], [place] [phone] Accepts children up to age 36 who are enrolled in [place] or [place] Health Choice; pregnant women with a Medicaid card; and children who have applied for Medicaid or [place] Health Choice, but were declined, whose parents can pay a reduced fee at time of service.  [facility] Department of [facility]  [address], [place] [phone] Accepts children up to age 23 who are enrolled in [place] or [place] Health Choice; pregnant women with a Medicaid card; and children who have applied for Medicaid or [facility]  Choice, but were declined, whose parents can pay a reduced fee at time of service.  [facility]  [address] [place], [place] [phone] Patients are seen by appointment only. Walk-ins are not accepted. [facility] will see patients 84 years of age and older. Monday - Tuesday (8am-5pm) Most Wednesdays (8:30-5pm) $30 per visit, cash only  [facility] [employer] [facility]  [address], [employer] [phone])  [phone] Patients are seen by appointment only. Walk-ins are not accepted. [facility] will see patients 65 years of age and older. One Wednesday Evening (Monthly: Volunteer Based).  $30 per visit, cash only  [employer]  [phone] for adults; Children under age 84, call Graduate Pediatric Dentistry at [phone]. Children aged 72-14, please call [phone] to request a pediatric application.  Dental services are provided in all areas of dental care including fillings, crowns and bridges, complete and partial dentures, implants, gum treatment, root canals, and extractions. Preventive care is also provided. Treatment is provided to both adults and children. Patients are selected via a lottery and there is often a waiting list.   [facility] [address], [place]  [phone] [email or web address]   [facility] [address] [place], [place] [phone], Ext. [phone] Second and Fourth Thursday of each month, opens at 6:30 AM; Clinic ends at 9 AM.  Patients are seen on a first-come first-served basis, and a limited number are seen during each clinic.   [facility]  [address] Ether Griffins [place], [place] [phone]   Eligibility Requirements You must have lived in [place], [place], or [place] counties for at least the last three months.   You cannot be eligible for state or federal sponsored [place], including [employer], [place], or [employer].   You  generally cannot be eligible for healthcare insurance through your employer.    How to apply: Eligibility screenings are held every Tuesday and Wednesday afternoon from 1:00 pm until 4:00 pm. You do not need an appointment for the interview!  [facility] [address], [place], [place] [phone]   [facility] Health Department  [phone]   [facility] Health Department  [phone]   [facility] Health Department  [phone]    Behavioral Health Resources in the Community: Intensive Outpatient Programs Organization         Address  Phone  Notes  [facility] Services [address] [place], [place] [phone]   [facility] Outpatient [address], [place], [place] [phone]   [facility] [address], [place], [place]  [phone]   [facility] [address],  [place], [place] [phone] or [phone]   Substance Abuse Resources Organization         Address  Phone  Notes  [facility] Drug [facility]  [phone]   [facility]  [phone]   [facility]  [phone]   Floydene Flock  [phone]   Residential & Outpatient Substance Abuse Program  [phone]   Psychological Services Organization         Address  Phone  Notes  [facility]  [phone]   [facility]  [phone]   [facility] Mental Health [address], [place] [phone] or [phone]    Mobile Crisis Teams Organization         Address  Phone  Notes  [facility], Mobile Crisis Care Unit  [phone]   [facility]  [address] [place], [place] [phone]   Doristine Locks [address] [place] [phone]    Self-Help/Support Groups Organization         Address  Phone             Notes  [facility] - variety of support groups  336- I7437963  Call for  more information  Narcotics Anonymous (NA), [facility] [address], [employer] [place]  2 meetings at this location   Residential Sports administrator         Address  Phone  Notes  [facility] [address],    [place]  [phone]   [facility]  [address] [place] [address], [place], [place] [phone]   [facility] [address] [place], [place] [phone] Admissions: 8am-3pm M-F  [facility] [address]    [place], [place] [phone]   [facility] [address] [place], [place], [place] [phone]   [facility] [address],  [place], [place] [phone]   [facility] - Intensive Outpatient [address]urell Josephs [address], [place], [place] [phone]   [facility] (Addiction [facility] [address] [place]  [place], [place] [phone] or [phone]   [facility] ([facility]) [address], [place], [place] [phone] Accepts Medicaid  [facility] [place] [address]  [place] [phone] Substance Abuse/Addiction Treatment   [facility] Organization         Address  Phone  Notes  [facility]  [phone]   Angie Fava, PhD [address]ack [place], [place]   [phone] or [phone]   [facility]   [address] [place], [place] [phone]   [facility] [address], [place], [place] [phone] Insurance/Medicaid/sponsorship through [facility] [address]                                    [place], [place] [phone] Therapy/tele-psych/case  [facility] [address], [place] [phone]    Dr. Lolly Mustache  [phone]   [facility] [place]  [employer] [facility] Dept. 1) [address] [place] 2) [address] [place] 3)  [address], [place] [phone]  [phone]   [facility] Child Abuse Hotline (252) 328-5731 or [phone] (After Hours)

## 2017-05-27 IMAGING — CT CT NECK W/ CM
4 of 5 series · 16 of 33 positions shown, 18 images · IV contrast (Iodine)
Comparison: Head CT 12/21/2011.  Maxillofacial CT 02/09/2010.

CLINICAL DATA: Bilateral neck swelling and pain for 3 days.
Dysphagia. Initial encounter.

EXAM:
CT NECK WITH CONTRAST
TECHNIQUE: Multidetector CT imaging of the neck was performed using the
standard protocol following the bolus administration of intravenous
contrast.
CONTRAST:  75 ml Omnipaque 300.

[Series 201: soft tissue, idose (2) · axial · 0.42mm/px · z∈[+138,+268]mm · 4 of 109 slices shown]
[im 22/109  soft-tissue]
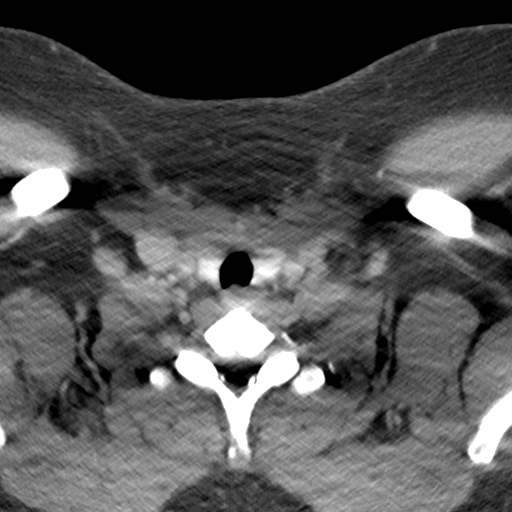
[im 44/109  soft-tissue]
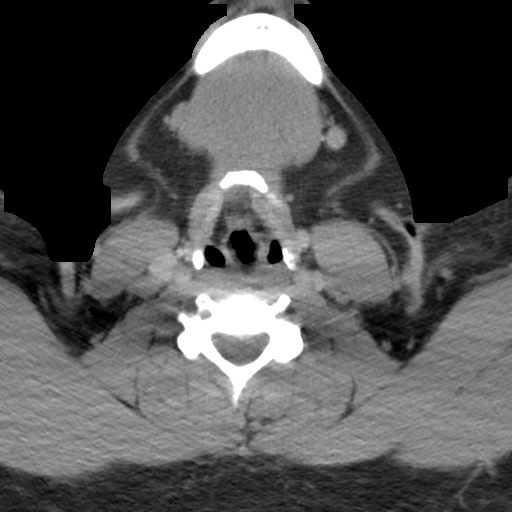
[im 65/109  soft-tissue]
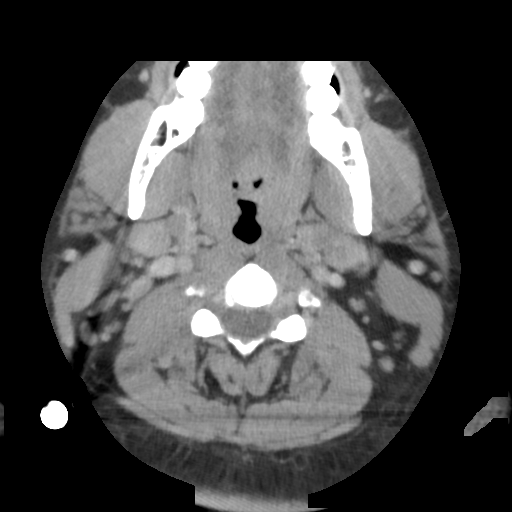
[im 87/109  soft-tissue]
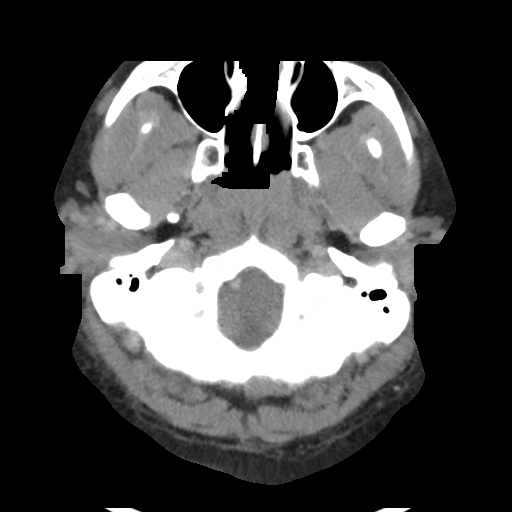

[Series 203: coronal, idose (2) · coronal · 0.47mm/px · 3 of 98 slices shown]
[im 20/98  bone]
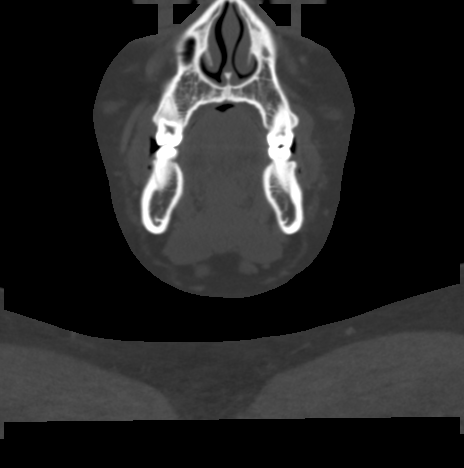
[im 39/98  bone]
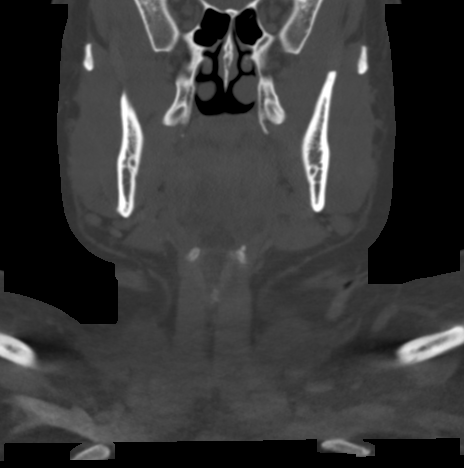
[im 59/98  bone]
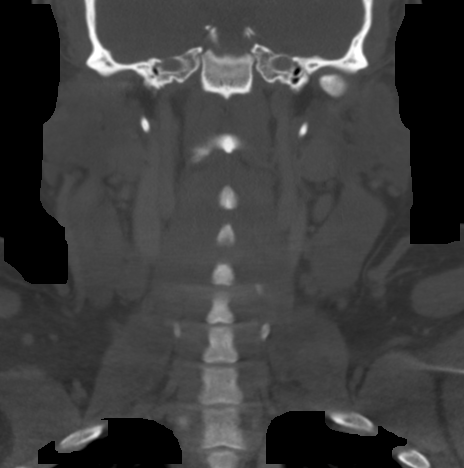

[Series 204: sagittal, idose (2) · sagittal · 0.42mm/px · 5 of 108 slices shown, 6 images]
[im 36/108  bone]
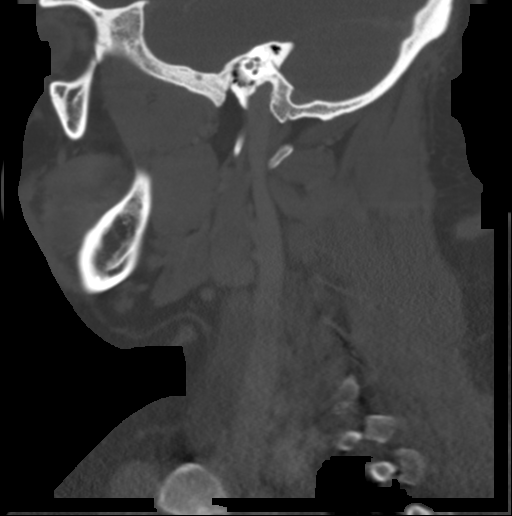
[im 45/108  bone]
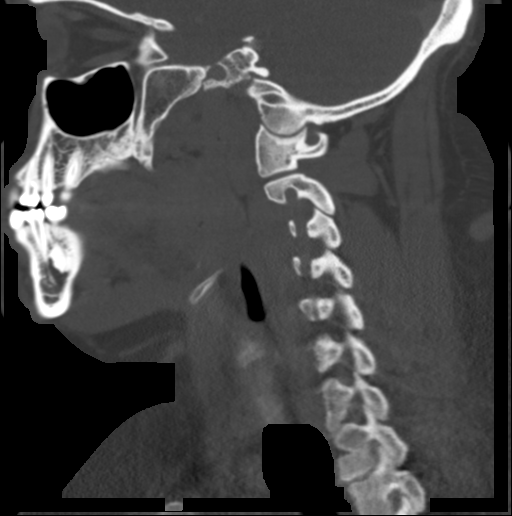
[im 54/108  soft-tissue]
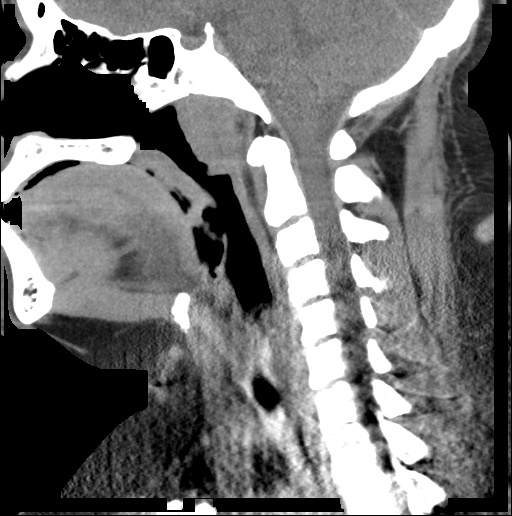
[im 54/108  bone]
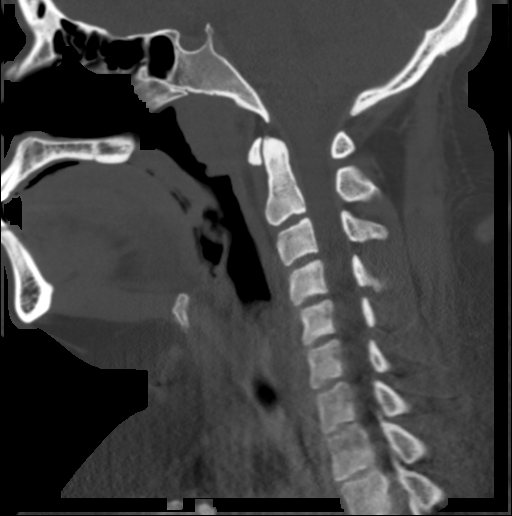
[im 63/108  bone]
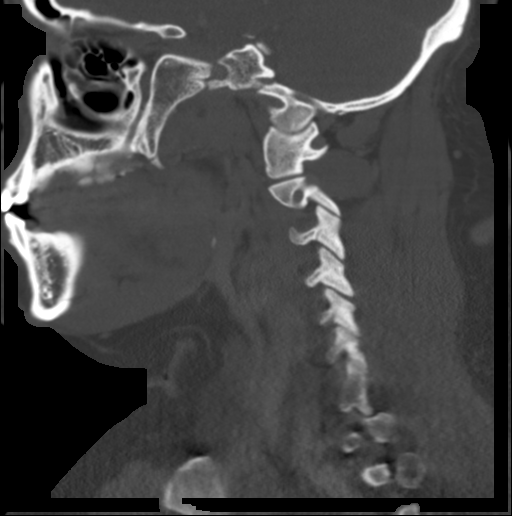
[im 72/108  bone]
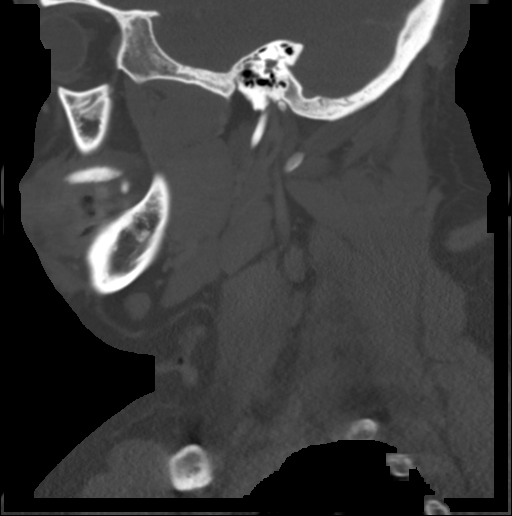

[Series 205: orthogonal, idose (2) · axial · 0.51mm/px · z∈[+119,+247]mm · 4 of 109 slices shown, 5 images]
[im 22/109  soft-tissue]
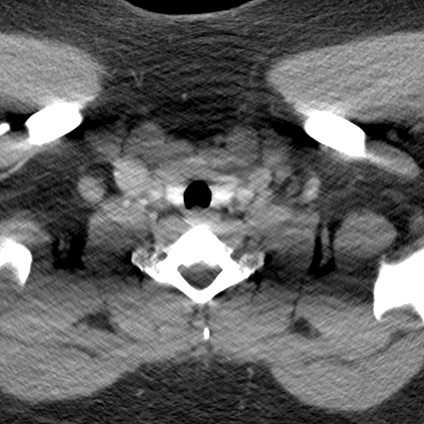
[im 22/109  bone]
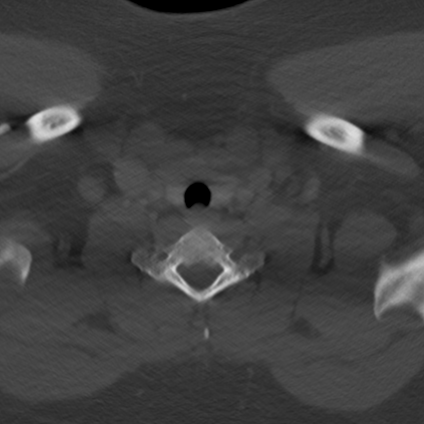
[im 44/109  bone]
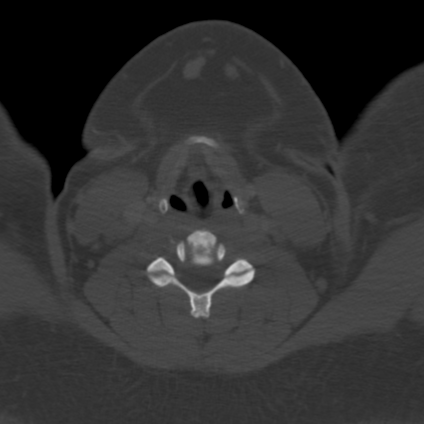
[im 65/109  bone]
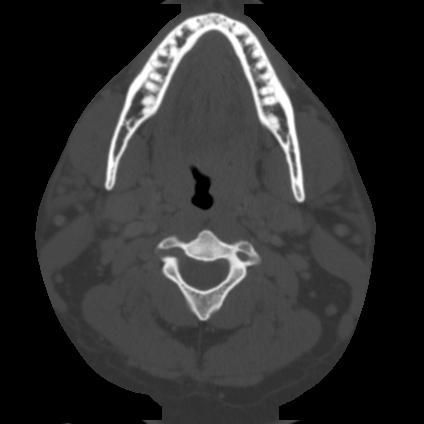
[im 87/109  bone]
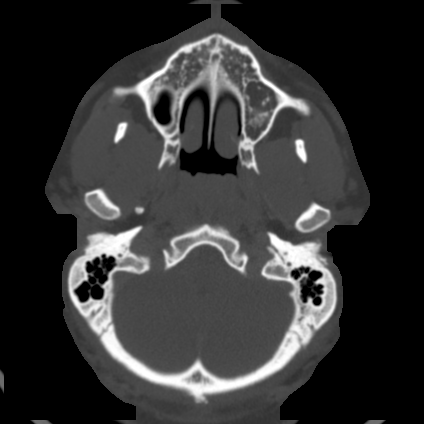

[16 of 33 positions shown; findings below may reference images not displayed]

FINDINGS: Pharynx and larynx: The larynx and epiglottis appear normal.
Prominence of the nasopharyngeal soft tissues and uvula is chronic
and slightly improved compared with the prior study. The
prevertebral soft tissues otherwise appear normal. No evidence of
airway compromise or focal inflammation.

Salivary glands: Unremarkable.

Thyroid: Normal in appearance.

Lymph nodes: There are several mildly enlarged level 2 and 3 lymph
nodes bilaterally. These measure up to 1.5 cm short axis on the
right (image 52) and 2.0 cm on the left (image 51 of series 205).
The prominence of these nodes has increased compared with the prior
study.

Vascular: No arterial or venous abnormalities identified.

Limited intracranial: The visualized intracranial contents are
unremarkable.

Visualized orbits: The orbits appear normal without inflammatory
changes.

Mastoids and visualized paranasal sinuses: Clear.

Skeleton: Unremarkable.

Upper chest: The lung apices are clear. No superior mediastinal
lymphadenopathy identified.
IMPRESSION: 1. No inflammatory changes, airway compromise or clear explanation
for the patient's symptoms identified.
2. Upper cervical lymphadenopathy is nonspecific but likely
reactive. Clinical follow up recommended.
3. Chronic prominence of the nasopharyngeal soft tissues and uvula.

## 2018-12-08 ENCOUNTER — Ambulatory Visit: Payer: Self-pay | Admitting: Family Medicine

## 2019-07-20 ENCOUNTER — Encounter: Payer: Self-pay | Admitting: Emergency Medicine

## 2019-07-20 ENCOUNTER — Other Ambulatory Visit: Payer: Self-pay

## 2019-07-20 ENCOUNTER — Emergency Department (HOSPITAL_COMMUNITY)
Admission: EM | Admit: 2019-07-20 | Discharge: 2019-07-20 | Disposition: A | Discharge status: Home / Self Care | Payer: Self-pay | Attending: Emergency Medicine | Admitting: Emergency Medicine

## 2019-07-20 ENCOUNTER — Ambulatory Visit
Admission: EM | Admit: 2019-07-20 | Discharge: 2019-07-20 | Disposition: A | Discharge status: Home / Self Care | Payer: Self-pay | Attending: Physician Assistant | Admitting: Physician Assistant

## 2019-07-20 DIAGNOSIS — E119 Type 2 diabetes mellitus without complications: Secondary | ICD-10-CM

## 2019-07-20 DIAGNOSIS — R350 Frequency of micturition: Secondary | ICD-10-CM

## 2019-07-20 DIAGNOSIS — R739 Hyperglycemia, unspecified: Secondary | ICD-10-CM

## 2019-07-20 DIAGNOSIS — R42 Dizziness and giddiness: Secondary | ICD-10-CM

## 2019-07-20 DIAGNOSIS — R5383 Other fatigue: Secondary | ICD-10-CM

## 2019-07-20 DIAGNOSIS — E1165 Type 2 diabetes mellitus with hyperglycemia: Secondary | ICD-10-CM | POA: Insufficient documentation

## 2019-07-20 LAB — BASIC METABOLIC PANEL
Anion gap: 14 (ref 5–15)
BUN: 11 mg/dL (ref 6–20)
CO2: 24 mmol/L (ref 22–32)
Calcium: 9.6 mg/dL (ref 8.9–10.3)
Chloride: 93 mmol/L — ABNORMAL LOW (ref 98–111)
Creatinine, Ser: 0.97 mg/dL (ref 0.61–1.24)
GFR calc Af Amer: >60 mL/min (ref >60)
GFR calc non Af Amer: >60 mL/min (ref >60)
Glucose, Bld: 560 mg/dL (ref 70–99)
Potassium: 4.2 mmol/L (ref 3.5–5.1)
Sodium: 131 mmol/L — ABNORMAL LOW (ref 135–145)

## 2019-07-20 LAB — POCT URINALYSIS DIP (MANUAL ENTRY)
Bilirubin, UA: NEGATIVE
Blood, UA: NEGATIVE
Glucose, UA: >=1000 mg/dL — AB
Leukocytes, UA: NEGATIVE
Nitrite, UA: NEGATIVE
Protein Ur, POC: NEGATIVE mg/dL
Spec Grav, UA: <=1.005 — AB (ref 1.010–1.025)
Urobilinogen, UA: 0.2 E.U./dL
pH, UA: 5 (ref 5.0–8.0)

## 2019-07-20 LAB — URINALYSIS, ROUTINE W REFLEX MICROSCOPIC
Bacteria, UA: NONE SEEN
Bilirubin Urine: NEGATIVE
Glucose, UA: >=500 mg/dL — AB
Hgb urine dipstick: NEGATIVE
Ketones, ur: 5 mg/dL — AB
Leukocytes,Ua: NEGATIVE
Nitrite: NEGATIVE
Protein, ur: NEGATIVE mg/dL
Specific Gravity, Urine: 1.026 (ref 1.005–1.030)
pH: 5 (ref 5.0–8.0)

## 2019-07-20 LAB — POCT I-STAT EG7
Acid-Base Excess: 3 mmol/L — ABNORMAL HIGH (ref 0.0–2.0)
Bicarbonate: 28.4 mmol/L — ABNORMAL HIGH (ref 20.0–28.0)
Calcium, Ion: 1.17 mmol/L (ref 1.15–1.40)
HCT: 49 % (ref 39.0–52.0)
Hemoglobin: 16.7 g/dL (ref 13.0–17.0)
O2 Saturation: 100 %
Potassium: 4.2 mmol/L (ref 3.5–5.1)
Sodium: 132 mmol/L — ABNORMAL LOW (ref 135–145)
TCO2: 30 mmol/L (ref 22–32)
pCO2, Ven: 43.4 mmHg — ABNORMAL LOW (ref 44.0–60.0)
pH, Ven: 7.423 (ref 7.250–7.430)
pO2, Ven: 212 mmHg — ABNORMAL HIGH (ref 32.0–45.0)

## 2019-07-20 LAB — CBG MONITORING, ED
Glucose-Capillary: 592 mg/dL (ref 70–99)
Glucose-Capillary: 369 mg/dL — ABNORMAL HIGH (ref 70–99)

## 2019-07-20 LAB — CBC
HCT: 48.9 % (ref 39.0–52.0)
Hemoglobin: 16.1 g/dL (ref 13.0–17.0)
MCH: 26.9 pg (ref 26.0–34.0)
MCHC: 32.9 g/dL (ref 30.0–36.0)
MCV: 81.8 fL (ref 80.0–100.0)
Platelets: 319 10*3/uL (ref 150–400)
RBC: 5.98 MIL/uL — ABNORMAL HIGH (ref 4.22–5.81)
RDW: 13.4 % (ref 11.5–15.5)
WBC: 12.5 10*3/uL — ABNORMAL HIGH (ref 4.0–10.5)
nRBC: 0 % (ref 0.0–0.2)

## 2019-07-20 LAB — POCT FASTING CBG KUC MANUAL ENTRY: POCT Glucose (KUC): 600 mg/dL — AB (ref 70–99)

## 2019-07-20 MED ORDER — SODIUM CHLORIDE 0.9 % IV BOLUS
1000.0000 mL | Freq: Once | INTRAVENOUS | Status: AC
  Administered 2019-07-20: 14:00:00 1000 mL via INTRAVENOUS

## 2019-07-20 MED ORDER — SODIUM CHLORIDE 0.9 % IV BOLUS
1000.0000 mL | Freq: Once | INTRAVENOUS | Status: AC
  Administered 2019-07-20: 16:00:00 1000 mL via INTRAVENOUS

## 2019-07-20 MED ORDER — METFORMIN HCL 500 MG PO TABS: 500.0000 mg | ORAL_TABLET | Freq: Two times a day (BID) | ORAL | 1 refills | Status: DC

## 2019-07-20 MED ORDER — INSULIN ASPART 100 UNIT/ML ~~LOC~~ SOLN
8.0000 [IU] | Freq: Once | SUBCUTANEOUS | Status: AC
  Administered 2019-07-20: 8 [IU] via INTRAVENOUS

## 2019-07-20 NOTE — ED Triage Notes (Signed)
Pt presents to [facility] for assessment of 2 days of blurry vision, frequent urination and dry mouth.  Patient denies personal, but states family hx of DM.

## 2019-07-20 NOTE — ED Provider Notes (Signed)
[facility] EMERGENCY DEPARTMENT Provider Note   CSN: [id number] Arrival date & time: 07/20/19  1235     History   Chief Complaint Chief Complaint  Patient presents with  . Hyperglycemia    HPI [name] is a 33 y.o. male.     HPI Patient sent from urgent care for new diabetes.  States that he is felt dizzy and lightheaded for last couple days.  States he had to get up around 7 times to urinate last night.  States he feels lightheaded and his mouth is dry.  No fevers chills or cough.  No history of diabetes.  States for the last week he has been getting up around 3 times a night.  He has not been on steroids.  No headache. No past medical history on file.  Patient Active Problem List   Diagnosis Date Noted  . Cholecystitis with cholelithiasis 09/17/2011    Past Surgical History:  Procedure Laterality Date  . CHOLECYSTECTOMY     planned for today  09/18/2011  . CHOLECYSTECTOMY  09/18/2011   Procedure: LAPAROSCOPIC CHOLECYSTECTOMY WITH INTRAOPERATIVE CHOLANGIOGRAM;  Surgeon: Shann Medal, MD;  Location: [facility];  Service: General;  Laterality: N/A;  . INGUINAL HERNIA REPAIR  age 48        Home Medications    Prior to Admission medications   Medication Sig Start Date End Date Taking? Authorizing Provider  acetaminophen (TYLENOL) 325 MG tablet Take 650 mg by mouth every 6 (six) hours as needed for mild pain.   Yes [provider]  ibuprofen (ADVIL,MOTRIN) 200 MG tablet Take 200 mg by mouth every 6 (six) hours as needed for mild pain.   Yes [provider]  magic mouthwash w/lidocaine SOLN Take 5 mLs by mouth 4 (four) times daily as needed for mouth pain. Swish and spit out Patient not taking: Reported on 07/20/2019 07/19/15   Charlann Lange, PA-C  omeprazole (PRILOSEC) 20 MG capsule Take 1 capsule (20 mg total) by mouth daily before breakfast. 30 minutes before meal Patient not taking: Reported on 07/19/2015 07/05/14 07/20/19   Muthersbaugh, Jarrett Soho, PA-C    Family History Family History  Problem Relation Age of Onset  . Diabetes Brother   . Diabetes Other     Social History Social History   Tobacco Use  . Smoking status: Never Smoker  . Smokeless tobacco: Never Used  Substance Use Topics  . Alcohol use: Yes    Comment: on occasion  . Drug use: No     Allergies   Patient has no known allergies.   Review of Systems Review of Systems  Constitutional: Positive for appetite change.  HENT: Negative for congestion.   Eyes: Positive for visual disturbance.  Respiratory: Negative for shortness of breath.   Cardiovascular: Negative for chest pain.  Gastrointestinal: Negative for abdominal distention.  Endocrine: Positive for polyuria.  Musculoskeletal: Negative for back pain.  Neurological: Positive for light-headedness. Negative for speech difficulty.  Psychiatric/Behavioral: Negative for confusion.     Physical Exam Updated Vital Signs BP 111/82 (BP Location: Right Arm)   Pulse 100   Temp 98.4 F (36.9 C) (Oral)   Resp 16   SpO2 95%   Physical Exam   ED Treatments / Results  Labs (all labs ordered are listed, but only abnormal results are displayed) Labs Reviewed  CBC - Abnormal; Notable for the following components:      Result Value   WBC 12.5 (*)    RBC 5.98 (*)  All other components within normal limits  URINALYSIS, ROUTINE W REFLEX MICROSCOPIC - Abnormal; Notable for the following components:   Color, Urine STRAW (*)    Glucose, UA >=500 (*)    Ketones, ur 5 (*)    All other components within normal limits  CBG MONITORING, ED - Abnormal; Notable for the following components:   Glucose-Capillary 592 (*)    All other components within normal limits  POCT I-STAT EG7 - Abnormal; Notable for the following components:   pCO2, Ven 43.4 (*)    pO2, Ven 212.0 (*)    Bicarbonate 28.4 (*)    Acid-Base Excess 3.0 (*)    Sodium 132 (*)    All other components within normal  limits  BASIC METABOLIC PANEL  I-STAT VENOUS BLOOD GAS, ED    EKG None  Radiology No results found.  Procedures Procedures (including critical care time)  Medications Ordered in ED Medications  sodium chloride 0.9 % bolus 1,000 mL (1,000 mLs Intravenous New Bag/Given 07/20/19 1410)     Initial Impression / Assessment and Plan / ED Course  I have reviewed the triage vital signs and the nursing notes.  Pertinent labs & imaging results that were available during my care of the patient were reviewed by me and considered in my medical decision making (see chart for details).        Patient with hyperglycemia.  New onset diabetes.  Has few ketones in urine but CMP still pending.  Some tachycardia.  Does not have a PCP.  Care will be turned over to Dr. Lynelle Doctor.  Final Clinical Impressions(s) / ED Diagnoses   Final diagnoses:  Hyperglycemia    ED Discharge Orders    None       Benjiman Core, MD 07/20/19 1507

## 2019-07-20 NOTE — ED Triage Notes (Signed)
Pt sent here from [facility] for new onset DM, CBG >600 and glucose in urine >1000. Pt endorses 3 days of blurry vision, dry mouth, and polyuria.

## 2019-07-20 NOTE — Discharge Instructions (Signed)
New onset DM. With CBG >600. Urine with >1000 glucose

## 2019-07-20 NOTE — ED Notes (Signed)
Patient able to ambulate independently  

## 2019-07-20 NOTE — ED Provider Notes (Signed)
Patient was initially seen by Dr. Alvino Chapel.  Please see his note.  Patient's labs are consistent with new onset diabetes without evidence of acidosis.  I discussed the findings with the patient.  He is feeling better after treatment in the ED.  Patient has been given a dose of insulin IV hydration.  Patient states his brother has diabetes and is currently on metformin.  He is familiar with that medication.  Plan on discharge with oral metformin and recommend the importance of outpatient follow-up with a primary care doctor, long term blood sugar monitoring.  Discussed diet, exercise.   Dorie Rank, MD 07/20/19 [phone]

## 2019-07-20 NOTE — Discharge Instructions (Signed)
Start taking the metformin.  Follow-up with a primary care doctor to follow-up on your diabetes.

## 2019-07-20 NOTE — ED Provider Notes (Signed)
33 year old male presents to the urgent care for 2-day history of blurry vision, urinary frequency, dry mouth, dizziness, weakness, nausea.  He denies fever, chills, body aches.  Denies syncope, chest pain, shortness of breath.   At triage, he was tachycardic at 109, tachypneic at 2 2.  Urine showed greater than 1000 glucose with 15 ketones.  CBG greater than 600.  Given patient with a history of diabetes, with greater than 600 CBG, urine with ketones, tachycardia, tachypnea, patient sent to the ED for further evaluation and management needed.   Ok Edwards, PA-C 07/20/19 1240

## 2019-07-30 ENCOUNTER — Inpatient Hospital Stay: Payer: Self-pay

## 2019-08-25 ENCOUNTER — Encounter: Payer: Self-pay | Admitting: Nurse Practitioner

## 2019-08-25 ENCOUNTER — Ambulatory Visit (HOSPITAL_BASED_OUTPATIENT_CLINIC_OR_DEPARTMENT_OTHER): Payer: Self-pay | Admitting: Pharmacist

## 2019-08-25 ENCOUNTER — Other Ambulatory Visit: Payer: Self-pay

## 2019-08-25 ENCOUNTER — Ambulatory Visit: Payer: Self-pay | Attending: Nurse Practitioner | Admitting: Nurse Practitioner

## 2019-08-25 VITALS — BP 139/89 | HR 92 | Temp 98.5°F | Resp 16 | Ht 69.0 in | Wt 317.6 lb

## 2019-08-25 DIAGNOSIS — Z114 Encounter for screening for human immunodeficiency virus [HIV]: Secondary | ICD-10-CM

## 2019-08-25 DIAGNOSIS — Z23 Encounter for immunization: Secondary | ICD-10-CM

## 2019-08-25 DIAGNOSIS — E1165 Type 2 diabetes mellitus with hyperglycemia: Secondary | ICD-10-CM

## 2019-08-25 LAB — POCT URINALYSIS DIP (CLINITEK)
Bilirubin, UA: NEGATIVE
Blood, UA: NEGATIVE
Glucose, UA: 500 mg/dL — AB
Ketones, POC UA: NEGATIVE mg/dL
Leukocytes, UA: NEGATIVE
Nitrite, UA: NEGATIVE
POC PROTEIN,UA: NEGATIVE
Spec Grav, UA: 1.015 (ref 1.010–1.025)
Urobilinogen, UA: 0.2 E.U./dL
pH, UA: 6 (ref 5.0–8.0)

## 2019-08-25 LAB — POCT GLYCOSYLATED HEMOGLOBIN (HGB A1C): HbA1c, POC (controlled diabetic range): 13.3 % — AB (ref 0.0–7.0)

## 2019-08-25 LAB — GLUCOSE, POCT (MANUAL RESULT ENTRY)
POC Glucose: 338 mg/dl — AB (ref 70–99)
POC Glucose: 345 mg/dl — AB (ref 70–99)

## 2019-08-25 MED ORDER — TRUE METRIX BLOOD GLUCOSE TEST VI STRP: ORAL_STRIP | 12 refills | Status: AC

## 2019-08-25 MED ORDER — TRUE METRIX METER W/DEVICE KIT: PACK | 0 refills | Status: AC

## 2019-08-25 MED ORDER — METFORMIN HCL 500 MG PO TABS: 500.0000 mg | ORAL_TABLET | Freq: Two times a day (BID) | ORAL | 1 refills | Status: DC

## 2019-08-25 MED ORDER — BD PEN NEEDLE MINI U/F 31G X 5 MM MISC: 6 refills | Status: DC

## 2019-08-25 MED ORDER — BASAGLAR KWIKPEN 100 UNIT/ML ~~LOC~~ SOPN: 30.0000 [IU] | PEN_INJECTOR | Freq: Every day | SUBCUTANEOUS | 1 refills | Status: DC

## 2019-08-25 MED ORDER — METFORMIN HCL 500 MG PO TABS: 1000.0000 mg | ORAL_TABLET | Freq: Two times a day (BID) | ORAL | 1 refills | Status: DC

## 2019-08-25 MED ORDER — TRUEPLUS LANCETS 28G MISC: 3 refills | Status: AC

## 2019-08-25 MED ORDER — LOSARTAN POTASSIUM 25 MG PO TABS: 12.5000 mg | ORAL_TABLET | Freq: Every day | ORAL | 2 refills | Status: DC

## 2019-08-25 MED ORDER — INSULIN ASPART 100 UNIT/ML ~~LOC~~ SOLN
20.0000 [IU] | Freq: Once | SUBCUTANEOUS | Status: AC
  Administered 2019-08-25: 20 [IU] via SUBCUTANEOUS

## 2019-08-25 MED FILL — TRUE METRIX TEST STRIP: 50 days supply | Qty: 100 | Fill #0

## 2019-08-25 MED FILL — TRUEplus LANCETS 28G MISC: 50 days supply | Qty: 100 | Fill #0

## 2019-08-25 MED FILL — !TRUE METRIX BLOOD GLUCOSE: 365 days supply | Qty: 1 | Fill #0

## 2019-08-25 MED FILL — ?BASAGLAR 100 UNITS/ML KWPE: 100 | 30 days supply | Qty: 9 | Fill #0

## 2019-08-25 MED FILL — LOSARTAN POTASSIUM 25 MG TA: 25 | 30 days supply | Qty: 15 | Fill #0

## 2019-08-25 MED FILL — ?METFORMIN HCL 500MG TABLET: 500 | 30 days supply | Qty: 120 | Fill #0

## 2019-08-25 MED FILL — TRUEPLUS 5-BEVEL PEN NEEDLE: 31G X 5 MM | 90 days supply | Qty: 100 | Fill #0

## 2019-08-25 NOTE — Progress Notes (Signed)
Patient presents for vaccination against strep pneumo and tetanus per orders of Zelda. Consent given. Counseling provided. No contraindications exists. Vaccine administered without incident.

## 2019-08-25 NOTE — Patient Instructions (Addendum)
Diabetes blood sugar goals  Fasting in AM before breakfast which means at least 8 hrs of no eating or drinking) except water or unsweetened coffee or tea): 90-130 1 hrs after meals: < 180,   Hypoglycemia or low blood sugar: < 70 (You should not have hypoglycemia.)  INCREASE YOUR LANTUS BY 2 UNITS EVERY 3 DAYS IF FASTING BLOOD GLUCOSE LEVEL GREATER THAN 130 IN THE MORNING   Aim for 30 minutes of exercise most days. Rethink what you drink. Water is great! Aim for 2-3 Carb Choices per meal (30-45 grams) +/- 1 either way  Aim for 0-15 Carbs per snack if hungry  Include protein in moderation with your meals and snacks  Consider reading food labels for Total Carbohydrate and Fat Grams of foods  Consider checking BG at alternate times per day  Continue taking medication as directed Be mindful about how much sugar you are adding to beverages and other foods. Fruit Punch - find one with no sugar  Measure and decrease portions of carbohydrate foods  Make your plate and don't go back for seconds                                                   Td Vaccine (Tetanus and Diphtheria): What You Need to Know 1. Why get vaccinated? Tetanus  and diphtheria are very serious diseases. They are rare in the [place] today, but people who do become infected often have severe complications. Td vaccine is used to protect adolescents and adults from both of these diseases. Both tetanus and diphtheria are infections caused by bacteria. Diphtheria spreads from person to person through coughing or sneezing. Tetanus-causing bacteria enter the body through cuts, scratches, or wounds. TETANUS (Lockjaw) causes painful muscle tightening and stiffness, usually all over the body.  It can lead to tightening of muscles in the head and neck so you can't open your mouth, swallow, or sometimes even breathe. Tetanus kills about 1 out of every 10 people who are infected  even after receiving the best medical care. DIPHTHERIA can cause a thick coating to form in the back of the throat.  It can lead to breathing problems, paralysis, heart failure, and death. Before vaccines, as many as 200,000 cases of diphtheria and hundreds of cases of tetanus were reported in the [place] each year. Since vaccination began, reports of cases for both diseases have dropped by about 99%. 2. Td vaccine Td vaccine can protect adolescents and adults from tetanus and diphtheria. Td is usually given as a booster dose every 10 years but it can also be given earlier after a severe and dirty wound or burn. Another vaccine, called Tdap, which protects against pertussis in addition to tetanus and diphtheria, is sometimes recommended instead of Td vaccine. Your doctor or the person giving you the vaccine can give you more information. Td may safely be given at the same time as other vaccines. 3. Some people should not get this vaccine  A person who has ever had a life-threatening allergic reaction after a previous dose of any tetanus or diphtheria containing vaccine, OR has a severe allergy to any part of this vaccine, should not get Td vaccine. Tell the person giving the vaccine about any severe allergies.  Talk to your doctor if you: ? had severe pain or swelling after any vaccine containing  diphtheria or tetanus, ? ever had a condition called Guillain Barr Syndrome (GBS), ? aren't feeling well on the day the shot is scheduled. 4. Risks of a vaccine reaction With any medicine, including vaccines, there is a chance of side effects. These are usually mild and go away on their own. Serious reactions are also possible but are rare. Most people who get Td vaccine do not have any problems with it. Mild Problems following Td vaccine: (Did not interfere with activities)  Pain where the shot was given (about 8 people in 10)  Redness or swelling where the shot was given (about 1 person in  4)  Mild fever (rare)  Headache (about 1 person in 4)  Tiredness (about 1 person in 4) Moderate Problems following Td vaccine: (Interfered with activities, but did not require medical attention)  Fever over 102F (rare) Severe Problems following Td vaccine: (Unable to perform usual activities; required medical attention)  Swelling, severe pain, bleeding and/or redness in the arm where the shot was given (rare). Problems that could happen after any vaccine:  People sometimes faint after a medical procedure, including vaccination. Sitting or lying down for about 15 minutes can help prevent fainting, and injuries caused by a fall. Tell your doctor if you feel dizzy, or have vision changes or ringing in the ears.  Some people get severe pain in the shoulder and have difficulty moving the arm where a shot was given. This happens very rarely.  Any medication can cause a severe allergic reaction. Such reactions from a vaccine are very rare, estimated at fewer than 1 in a million doses, and would happen within a few minutes to a few hours after the vaccination. As with any medicine, there is a very remote chance of a vaccine causing a serious injury or death. The safety of vaccines is always being monitored. For more information, visit: http://www.aguilar.org/ 5. What if there is a serious reaction? What should I look for?  Look for anything that concerns you, such as signs of a severe allergic reaction, very high fever, or unusual behavior. Signs of a severe allergic reaction can include hives, swelling of the face and throat, difficulty breathing, a fast heartbeat, dizziness, and weakness. These would usually start a few minutes to a few hours after the vaccination. What should I do?  If you think it is a severe allergic reaction or other emergency that can't wait, call 9-1-1 or get the person to the nearest hospital. Otherwise, call your doctor.  Afterward, the reaction should be  reported to the Vaccine Adverse Event Reporting System (VAERS). Your doctor might file this report, or you can do it yourself through the VAERS web site at www.vaers.SamedayNews.es, or by calling 608-426-3361. VAERS does not give medical advice. 6. The National Vaccine Injury Compensation Program The [employer] Vaccine Injury Compensation Program (VICP) is a federal program that was created to compensate people who may have been injured by certain vaccines. Persons who believe they may have been injured by a vaccine can learn about the program and about filing a claim by calling (559) 506-5433 or visiting the [facility] website at GoldCloset.com.ee. There is a time limit to file a claim for compensation. 7. How can I learn more?  Ask your doctor. He or she can give you the vaccine package insert or suggest other sources of information.  Call your local or state health department.  Contact the Centers for Disease Control and Prevention (CDC): ? Call 636-480-5776 (1-800-CDC-INFO) ? Visit CDC's website at  PicCapture.uy Vaccine Information Statement Td Vaccine (01/31/16) This information is not intended to replace advice given to you by your health care provider. Make sure you discuss any questions you have with your health care provider. Document Released: 08/05/2006 Document Revised: 05/26/2018 Document Reviewed: 05/26/2018 Elsevier Interactive Patient Education  2020 Elsevier Inc.   Pneumococcal Polysaccharide Vaccine (PPSV23): What You Need to Know 1. Why get vaccinated? Pneumococcal polysaccharide vaccine (PPSV23) can prevent pneumococcal disease. Pneumococcal disease refers to any illness caused by pneumococcal bacteria. These bacteria can cause many types of illnesses, including pneumonia, which is an infection of the lungs. Pneumococcal bacteria are one of the most common causes of pneumonia. Besides pneumonia, pneumococcal bacteria can also cause:  Ear infections  Sinus  infections  Meningitis (infection of the tissue covering the brain and spinal cord)  Bacteremia (bloodstream infection) Anyone can get pneumococcal disease, but children under 15 years of age, people with certain medical conditions, adults 65 years or older, and cigarette smokers are at the highest risk. Most pneumococcal infections are mild. However, some can result in long-term problems, such as brain damage or hearing loss. Meningitis, bacteremia, and pneumonia caused by pneumococcal disease can be fatal. 2. PPSV23 PPSV23 protects against 23 types of bacteria that cause pneumococcal disease. PPSV23 is recommended for:  All adults 65 years or older,  Anyone 2 years or older with certain medical conditions that can lead to an increased risk for pneumococcal disease. Most people need only one dose of PPSV23. A second dose of PPSV23, and another type of pneumococcal vaccine called PCV13, are recommended for certain high-risk groups. Your health care provider can give you more information. People 65 years or older should get a dose of PPSV23 even if they have already gotten one or more doses of the vaccine before they turned 54. 3. Talk with your health care provider Tell your vaccine provider if the person getting the vaccine:  Has had an allergic reaction after a previous dose of PPSV23, or has any severe, life-threatening allergies. In some cases, your health care provider may decide to postpone PPSV23 vaccination to a future visit. People with minor illnesses, such as a cold, may be vaccinated. People who are moderately or severely ill should usually wait until they recover before getting PPSV23. Your health care provider can give you more information. 4. Risks of a vaccine reaction  Redness or pain where the shot is given, feeling tired, fever, or muscle aches can happen after PPSV23. People sometimes faint after medical procedures, including vaccination. Tell your provider if you feel  dizzy or have vision changes or ringing in the ears. As with any medicine, there is a very remote chance of a vaccine causing a severe allergic reaction, other serious injury, or death. 5. What if there is a serious problem? An allergic reaction could occur after the vaccinated person leaves the clinic. If you see signs of a severe allergic reaction (hives, swelling of the face and throat, difficulty breathing, a fast heartbeat, dizziness, or weakness), call 9-1-1 and get the person to the nearest hospital. For other signs that concern you, call your health care provider. Adverse reactions should be reported to the Vaccine Adverse Event Reporting System (VAERS). Your health care provider will usually file this report, or you can do it yourself. Visit the VAERS website at www.vaers.LAgents.no or call 628-467-7794. VAERS is only for reporting reactions, and VAERS staff do not give medical advice. 6. How can I learn more?  Ask your health care  provider.  Call your local or state health department.  Contact the Centers for Disease Control and Prevention (CDC): ? Call 867-760-3200 (1-800-CDC-INFO) or ? Visit CDC's website at PicCapture.uy CDC Vaccine Information Statement PPSV23 Vaccine (08/20/2018) This information is not intended to replace advice given to you by your health care provider. Make sure you discuss any questions you have with your health care provider. Document Released: 08/05/2006 Document Revised: 01/27/2019 Document Reviewed: 05/20/2018 Elsevier Patient Education  2020 [employer].

## 2019-08-25 NOTE — Progress Notes (Signed)
Assessment & Plan:  [name] was seen today for hospitalization follow-up.  Diagnoses and all orders for this visit:  Uncontrolled type 2 diabetes mellitus with hyperglycemia (HCC) -     POCT glucose (manual entry) -     POCT glycosylated hemoglobin (Hb A1C) -     Microalbumin / creatinine urine ratio -     POCT URINALYSIS DIP (CLINITEK) -     insulin aspart (novoLOG) injection 20 Units -     Insulin Glargine (BASAGLAR KWIKPEN) 100 UNIT/ML SOPN; Inject 0.3 mLs (30 Units total) into the skin at bedtime. -     CBC -     CMP14+EGFR -     Lipid panel -     TSH -     Insulin Pen Needle (B-D UF III MINI PEN NEEDLES) 31G X 5 MM MISC; Use as instructed. Inject into the skin once nightly. -     Blood Glucose Monitoring Suppl (TRUE METRIX METER) w/Device KIT; Use as instructed. Check blood glucose level by fingerstick TWICE per day. -     glucose blood (TRUE METRIX BLOOD GLUCOSE TEST) test strip; Use as instructed. Check blood glucose level by fingerstick TWICE per day. -     TRUEplus Lancets 28G MISC; Use as instructed. Check blood glucose level by fingerstick TWICE per day. -     metFORMIN (GLUCOPHAGE) 500 MG tablet; Take 2 tablets (1,000 mg total) by mouth 2 (two) times daily with a meal. -     POCT glucose (manual entry) -     losartan (COZAAR) 25 MG tablet; Take 0.5 tablets (12.5 mg total) by mouth daily.  Continue blood sugar control as discussed in office today, low carbohydrate diet, and regular physical exercise as tolerated, 150 minutes per week (30 min each day, 5 days per week, or 50 min 3 days per week). Keep blood sugar logs with fasting goal of 90-130 mg/dl, post prandial (after you eat) less than 180.  For Hypoglycemia: BS <60 and Hyperglycemia BS >400; contact the clinic ASAP. Annual eye exams and foot exams are recommended.   Encounter for screening for HIV -     HIV antibody (with reflex)   Patient has been counseled on age-appropriate routine health concerns for  screening and prevention. These are reviewed and up-to-date. Referrals have been placed accordingly. Immunizations are up-to-date or declined.    Subjective:   Chief Complaint  Patient presents with  . Hospitalization Follow-up    ED   HPI [name] 33 y.o. male presents to office today to establish care.  has a past medical history of Diabetes mellitus without complication ([place]).   DM TYPE 2 NEWLY diagnosed. Currently only taking metformin 500 mg BID. Will increase to 1000 mg BID and add Basaglar 30 units nightly. He is asked to titrate his insulin 2 units every 3 days for fasting blood glucose levels over 130. We had a lengthy discussion regarding dietary and exercise modifications including carbohydrates and portion control.  He currently denies any hyperglycemic symptoms.  We will also start low-dose losartan for kidney protection as well as slightly elevated blood pressure.  Fasting lipid panel pending however will likely also start statin soon based on ADA guidelines. Lab Results  Component Value Date   HGBA1C 13.3 (A) 08/25/2019   BP Readings from Last 3 Encounters:  08/25/19 139/89  07/20/19 121/78  07/20/19 128/84    Review of Systems  Constitutional: Negative for fever, malaise/fatigue and weight loss.  HENT: Negative.  Negative for nosebleeds.   Eyes: Negative.  Negative for blurred vision, double vision and photophobia.  Respiratory: Negative.  Negative for cough and shortness of breath.   Cardiovascular: Negative.  Negative for chest pain, palpitations and leg swelling.  Gastrointestinal: Negative.  Negative for heartburn, nausea and vomiting.  Musculoskeletal: Negative.  Negative for myalgias.  Neurological: Negative.  Negative for dizziness, focal weakness, seizures and headaches.  Psychiatric/Behavioral: Negative.  Negative for suicidal ideas.    Past Medical History:  Diagnosis Date  . Diabetes mellitus without complication [facility])     Past Surgical  History:  Procedure Laterality Date  . CHOLECYSTECTOMY     planned for today  09/18/2011  . CHOLECYSTECTOMY  09/18/2011   Procedure: LAPAROSCOPIC CHOLECYSTECTOMY WITH INTRAOPERATIVE CHOLANGIOGRAM;  Surgeon: Shann Medal, MD;  Location: [place];  Service: General;  Laterality: N/A;  . INGUINAL HERNIA REPAIR  age 62    Family History  Problem Relation Age of Onset  . Diabetes Brother   . Diabetes Other     Social History Reviewed with no changes to be made today.   Outpatient Medications Prior to Visit  Medication Sig Dispense Refill  . metFORMIN (GLUCOPHAGE) 500 MG tablet Take 1 tablet (500 mg total) by mouth 2 (two) times daily with a meal. 60 tablet 1  . acetaminophen (TYLENOL) 325 MG tablet Take 650 mg by mouth every 6 (six) hours as needed for mild pain.    Marland Kitchen ibuprofen (ADVIL,MOTRIN) 200 MG tablet Take 200 mg by mouth every 6 (six) hours as needed for mild pain.    . magic mouthwash w/lidocaine SOLN Take 5 mLs by mouth 4 (four) times daily as needed for mouth pain. Swish and spit out (Patient not taking: Reported on 07/20/2019) 80 mL 0   No facility-administered medications prior to visit.     No Known Allergies     Objective:    BP 139/89   Pulse 92   Temp 98.5 F (36.9 C) (Oral)   Resp 16   Ht 5' 9"  (1.753 m)   Wt (!) 317 lb 9.6 oz (144.1 kg)   SpO2 97%   BMI 46.90 kg/m  Wt Readings from Last 3 Encounters:  08/25/19 (!) 317 lb 9.6 oz (144.1 kg)  07/19/15 (!) 337 lb (152.9 kg)  07/05/14 270 lb (122.5 kg)    Physical Exam Vitals signs and nursing note reviewed.  Constitutional:      Appearance: He is well-developed.  HENT:     Head: Normocephalic and atraumatic.  Neck:     Musculoskeletal: Normal range of motion.  Cardiovascular:     Rate and Rhythm: Normal rate and regular rhythm.     Heart sounds: Normal heart sounds. No murmur. No friction rub. No gallop.   Pulmonary:     Effort: Pulmonary effort is normal. No tachypnea or respiratory distress.      Breath sounds: Normal breath sounds. No decreased breath sounds, wheezing, rhonchi or rales.  Chest:     Chest wall: No tenderness.  Abdominal:     General: Bowel sounds are normal.     Palpations: Abdomen is soft.  Musculoskeletal: Normal range of motion.  Skin:    General: Skin is warm and dry.  Neurological:     Mental Status: He is alert and oriented to person, place, and time.     Coordination: Coordination normal.  Psychiatric:        Behavior: Behavior normal. Behavior is cooperative.  Thought Content: Thought content normal.        Judgment: Judgment normal.          Patient has been counseled extensively about nutrition and exercise as well as the importance of adherence with medications and regular follow-up. The patient was given clear instructions to go to ER or return to medical center if symptoms don't improve, worsen or new problems develop. The patient verbalized understanding.   Follow-up: Return in about 4 weeks (around 09/22/2019) for [place]KE: [place] and diabetes education 4 WEEKS THEN SEE ME 11-24-2018. NEEDS FINANCIAL PAPERS TODAY.   Gildardo Pounds, FNP-BC [facility] and [place], [place]   08/25/2019, 12:30 PM

## 2019-08-25 NOTE — Progress Notes (Signed)
[id number] a1c-13.3

## 2019-08-26 LAB — CMP14+EGFR
ALT: 29 IU/L (ref 0–44)
AST: 22 IU/L (ref 0–40)
Albumin/Globulin Ratio: 1.2 (ref 1.2–2.2)
Albumin: 4.3 g/dL (ref 4.0–5.0)
Alkaline Phosphatase: 89 IU/L (ref 39–117)
BUN/Creatinine Ratio: 10 (ref 9–20)
BUN: 8 mg/dL (ref 6–20)
Bilirubin Total: 0.5 mg/dL (ref 0.0–1.2)
CO2: 21 mmol/L (ref 20–29)
Calcium: 10.3 mg/dL — ABNORMAL HIGH (ref 8.7–10.2)
Chloride: 96 mmol/L (ref 96–106)
Creatinine, Ser: 0.79 mg/dL (ref 0.76–1.27)
GFR calc Af Amer: 138 mL/min/{1.73_m2} (ref >59)
GFR calc non Af Amer: 120 mL/min/{1.73_m2} (ref >59)
Globulin, Total: 3.5 g/dL (ref 1.5–4.5)
Glucose: 304 mg/dL — ABNORMAL HIGH (ref 65–99)
Potassium: 5 mmol/L (ref 3.5–5.2)
Sodium: 137 mmol/L (ref 134–144)
Total Protein: 7.8 g/dL (ref 6.0–8.5)

## 2019-08-26 LAB — CBC
Hematocrit: 48.6 % (ref 37.5–51.0)
Hemoglobin: 15.7 g/dL (ref 13.0–17.7)
MCH: 26.2 pg — ABNORMAL LOW (ref 26.6–33.0)
MCHC: 32.3 g/dL (ref 31.5–35.7)
MCV: 81 fL (ref 79–97)
Platelets: 412 10*3/uL (ref 150–450)
RBC: 6 x10E6/uL — ABNORMAL HIGH (ref 4.14–5.80)
RDW: 12.9 % (ref 11.6–15.4)
WBC: 12.6 10*3/uL — ABNORMAL HIGH (ref 3.4–10.8)

## 2019-08-26 LAB — LIPID PANEL
Chol/HDL Ratio: 4.2 ratio (ref 0.0–5.0)
Cholesterol, Total: 176 mg/dL (ref 100–199)
HDL: 42 mg/dL (ref >39)
LDL Chol Calc (NIH): 106 mg/dL — ABNORMAL HIGH (ref 0–99)
Triglycerides: 162 mg/dL — ABNORMAL HIGH (ref 0–149)
VLDL Cholesterol Cal: 28 mg/dL (ref 5–40)

## 2019-08-26 LAB — HIV ANTIBODY (ROUTINE TESTING W REFLEX): HIV Screen 4th Generation wRfx: NONREACTIVE

## 2019-08-26 LAB — MICROALBUMIN / CREATININE URINE RATIO
Creatinine, Urine: 30.7 mg/dL
Microalb/Creat Ratio: <10 mg/g creat (ref 0–29)
Microalbumin, Urine: <3 ug/mL

## 2019-08-26 LAB — TSH: TSH: 2.42 u[IU]/mL (ref 0.450–4.500)

## 2019-08-29 ENCOUNTER — Other Ambulatory Visit: Payer: Self-pay | Admitting: Nurse Practitioner

## 2019-08-29 DIAGNOSIS — D72829 Elevated white blood cell count, unspecified: Secondary | ICD-10-CM

## 2019-08-29 MED ORDER — ATORVASTATIN CALCIUM 20 MG PO TABS: 20.0000 mg | ORAL_TABLET | Freq: Every day | ORAL | 3 refills | Status: DC

## 2019-08-31 MED FILL — ATORVASTATIN CALCIUM 20 MG: 20 | 30 days supply | Qty: 30 | Fill #0

## 2019-09-01 ENCOUNTER — Telehealth: Payer: Self-pay | Admitting: Hematology and Oncology

## 2019-09-01 ENCOUNTER — Encounter: Payer: Self-pay | Admitting: Nurse Practitioner

## 2019-09-01 NOTE — Telephone Encounter (Signed)
[name] has been cld and scheduled to see Dr. Lorenso Courier on 11/24 at 2pm. Pt has been made aware to arrive 15 minutes early.

## 2019-09-14 NOTE — Progress Notes (Deleted)
[place] [phone]   Fax:([phone] [place] NOTE  Patient Care Team: Gildardo Pounds, NP as PCP - General (Nurse Practitioner)  Hematological/Oncological History # ***  CHIEF COMPLAINTS/PURPOSE OF CONSULTATION:  "*** "  HISTORY OF PRESENTING ILLNESS:  [name] 33 y.o. male with medical history significant for ***  MEDICAL HISTORY:  Past Medical History:  Diagnosis Date  . Diabetes mellitus without complication ([place])     SURGICAL HISTORY: Past Surgical History:  Procedure Laterality Date  . CHOLECYSTECTOMY     planned for today  09/18/2011  . CHOLECYSTECTOMY  09/18/2011   Procedure: LAPAROSCOPIC CHOLECYSTECTOMY WITH INTRAOPERATIVE CHOLANGIOGRAM;  Surgeon: Shann Medal, MD;  Location: [place];  Service: General;  Laterality: N/A;  . INGUINAL HERNIA REPAIR  age 33    SOCIAL HISTORY: Social History   Socioeconomic History  . Marital status: Married    Spouse name: Not on file  . Number of children: Not on file  . Years of education: Not on file  . Highest education level: Not on file  Occupational History  . 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 Financial resource strain: Not on file  . Food insecurity    Worry: Not on file    Inability: Not on file  . Transportation needs    Medical: Not on file    Non-medical: Not on file  Tobacco Use  . Smoking status: Never Smoker  . Smokeless tobacco: Never Used  Substance and Sexual Activity  . Alcohol use: Yes    Comment: on occasion  . Drug use: No  . Sexual activity: Yes    Birth control/protection: Condom  Lifestyle  . Physical activity    Days per week: Not on file    Minutes per session: Not on file  . Stress: Not on file  Relationships  . Social Herbalist on phone: Not on file    Gets together: Not on file    Attends religious service: Not on file    Active member of club or organization: Not on file    Attends  meetings of clubs or organizations: Not on file    Relationship status: Not on file  . Intimate partner violence    Fear of current or ex partner: Not on file    Emotionally abused: Not on file    Physically abused: Not on file    Forced sexual activity: Not on file  Other Topics Concern  . Not on file  Social History Narrative  . Not on file    FAMILY HISTORY: Family History  Problem Relation Age of Onset  . Diabetes Brother   . Diabetes Other     ALLERGIES:  has No Known Allergies.  MEDICATIONS:  Current Outpatient Medications  Medication Sig Dispense Refill  . acetaminophen (TYLENOL) 325 MG tablet Take 650 mg by mouth every 6 (six) hours as needed for mild pain.    Marland Kitchen atorvastatin (LIPITOR) 20 MG tablet Take 1 tablet (20 mg total) by mouth daily at 6 PM. 90 tablet 3  . Blood Glucose Monitoring Suppl (TRUE METRIX METER) w/Device KIT Use as instructed. Check blood glucose level by fingerstick TWICE per day. 1 kit 0  . glucose blood (TRUE METRIX BLOOD GLUCOSE TEST) test strip Use as instructed. Check blood glucose level by fingerstick TWICE per day. 100 each 12  . ibuprofen (ADVIL,MOTRIN) 200 MG tablet Take 200 mg by mouth every  6 (six) hours as needed for mild pain.    . Insulin Glargine (BASAGLAR KWIKPEN) 100 UNIT/ML SOPN Inject 0.3 mLs (30 Units total) into the skin at bedtime. 30 mL 1  . Insulin Pen Needle (B-D UF III MINI PEN NEEDLES) 31G X 5 MM MISC Use as instructed. Inject into the skin once nightly. 100 each 6  . losartan (COZAAR) 25 MG tablet Take 0.5 tablets (12.5 mg total) by mouth daily. 45 tablet 2  . metFORMIN (GLUCOPHAGE) 500 MG tablet Take 2 tablets (1,000 mg total) by mouth 2 (two) times daily with a meal. 360 tablet 1  . TRUEplus Lancets 28G MISC Use as instructed. Check blood glucose level by fingerstick TWICE per day. 100 each 3   No current facility-administered medications for this visit.     REVIEW OF SYSTEMS:   Constitutional: ( - ) fevers, ( - )   chills , ( - ) night sweats Eyes: ( - ) blurriness of vision, ( - ) double vision, ( - ) watery eyes Ears, nose, mouth, throat, and face: ( - ) mucositis, ( - ) sore throat Respiratory: ( - ) cough, ( - ) dyspnea, ( - ) wheezes Cardiovascular: ( - ) palpitation, ( - ) chest discomfort, ( - ) lower extremity swelling Gastrointestinal:  ( - ) nausea, ( - ) heartburn, ( - ) change in bowel habits Skin: ( - ) abnormal skin rashes Lymphatics: ( - ) new lymphadenopathy, ( - ) easy bruising Neurological: ( - ) numbness, ( - ) tingling, ( - ) new weaknesses Behavioral/Psych: ( - ) mood change, ( - ) new changes  All other systems were reviewed with the patient and are negative.  PHYSICAL EXAMINATION: ECOG PERFORMANCE STATUS: {CHL ONC ECOG [phone]}  There were no vitals filed for this visit. There were no vitals filed for this visit.  GENERAL: well appearing *** in NAD  SKIN: skin color, texture, turgor are normal, no rashes or significant lesions EYES: conjunctiva are pink and non-injected, sclera clear OROPHARYNX: no exudate, no erythema; lips, buccal mucosa, and tongue normal  NECK: supple, non-tender LYMPH:  no palpable lymphadenopathy in the cervical, axillary or inguinal LUNGS: clear to auscultation and percussion with normal breathing effort HEART: regular rate & rhythm and no murmurs and no lower extremity edema ABDOMEN: soft, non-tender, non-distended, normal bowel sounds Musculoskeletal: no cyanosis of digits and no clubbing  PSYCH: alert & oriented x 3, fluent speech NEURO: no focal motor/sensory deficits  LABORATORY DATA:  I have reviewed the data as listed Lab Results  Component Value Date   WBC 12.6 (H) 08/25/2019   HGB 15.7 08/25/2019   HCT 48.6 08/25/2019   MCV 81 08/25/2019   PLT 412 08/25/2019   NEUTROABS 9.8 (H) 07/17/2015    PATHOLOGY: ***  BLOOD FILM: *** I personally reviewed the patient's peripheral blood smear today.  There was no peripheral blast.   The white blood cells and red blood cells were of normal morphology. There was no schistocytosis or anisocytosis.  The platelets are of normal size and I have verified that there were no platelet clumping.  RADIOGRAPHIC STUDIES: I have personally reviewed the radiological images as listed and agreed with the findings in the report. No results found.  ASSESSMENT & PLAN ***  No orders of the defined types were placed in this encounter.   All questions were answered. The patient knows to call the clinic with any problems, questions or concerns.  A total of  more than {CHL ONC TIME VISIT - FRHZJ:[id number]} were spent face-to-face with the patient during this encounter and over half of that time was spent on counseling and coordination of care as outlined above.   Ledell Peoples, MD Department of Hematology/Oncology [facility] Phone: [phone] Pager: [phone] Email: [email or web address]  09/14/2019 6:04 PM

## 2019-09-15 ENCOUNTER — Other Ambulatory Visit: Payer: Self-pay

## 2019-09-15 ENCOUNTER — Inpatient Hospital Stay: Payer: MEDICAID | Attending: Hematology and Oncology | Admitting: Hematology and Oncology

## 2019-09-22 ENCOUNTER — Ambulatory Visit: Payer: Self-pay | Admitting: Pharmacist

## 2019-10-19 MED FILL — ?METFORMIN HCL 500MG TABLET: 500 | 30 days supply | Qty: 120 | Fill #1

## 2019-11-26 ENCOUNTER — Ambulatory Visit: Payer: Self-pay | Attending: Nurse Practitioner | Admitting: Nurse Practitioner

## 2019-11-26 ENCOUNTER — Encounter: Payer: Self-pay | Admitting: Nurse Practitioner

## 2019-11-26 ENCOUNTER — Other Ambulatory Visit: Payer: Self-pay

## 2019-11-26 VITALS — BP 126/89 | HR 108 | Temp 98.8°F | Ht 69.0 in | Wt 314.0 lb

## 2019-11-26 DIAGNOSIS — E1165 Type 2 diabetes mellitus with hyperglycemia: Secondary | ICD-10-CM

## 2019-11-26 DIAGNOSIS — E785 Hyperlipidemia, unspecified: Secondary | ICD-10-CM

## 2019-11-26 DIAGNOSIS — D72829 Elevated white blood cell count, unspecified: Secondary | ICD-10-CM

## 2019-11-26 LAB — POCT GLYCOSYLATED HEMOGLOBIN (HGB A1C): Hemoglobin A1C: 6.9 % — AB (ref 4.0–5.6)

## 2019-11-26 LAB — GLUCOSE, POCT (MANUAL RESULT ENTRY): POC Glucose: 134 mg/dl — AB (ref 70–99)

## 2019-11-26 MED ORDER — LOSARTAN POTASSIUM 25 MG PO TABS: 12.5000 mg | ORAL_TABLET | Freq: Every day | ORAL | 2 refills | Status: DC

## 2019-11-26 MED ORDER — ATORVASTATIN CALCIUM 20 MG PO TABS: 20.0000 mg | ORAL_TABLET | Freq: Every day | ORAL | 3 refills | Status: AC

## 2019-11-26 MED ORDER — METFORMIN HCL 500 MG PO TABS: 500.0000 mg | ORAL_TABLET | Freq: Two times a day (BID) | ORAL | 3 refills | Status: DC

## 2019-11-26 MED FILL — LOSARTAN POTASSIUM 25 MG TA: 25 | 30 days supply | Qty: 15 | Fill #0

## 2019-11-26 MED FILL — ATORVASTATIN CALCIUM 20 MG: 20 | 30 days supply | Qty: 30 | Fill #0

## 2019-11-26 MED FILL — metFORMIN HCL 500 MG TABS: 500 | 30 days supply | Qty: 60 | Fill #0

## 2019-11-26 NOTE — Progress Notes (Signed)
Assessment & Plan:  [name] was seen today for follow-up.  Diagnoses and all orders for this visit:  Uncontrolled type 2 diabetes mellitus with hyperglycemia (HCC) -     Glucose (CBG) -     HgB A1c -     losartan (COZAAR) 25 MG tablet; Take 0.5 tablets (12.5 mg total) by mouth daily. -     metFORMIN (GLUCOPHAGE) 500 MG tablet; Take 1 tablet (500 mg total) by mouth 2 (two) times daily with a meal. -     Basic metabolic panel Continue blood sugar control as discussed in office today, low carbohydrate diet, and regular physical exercise as tolerated, 150 minutes per week (30 min each day, 5 days per week, or 50 min 3 days per week). Keep blood sugar logs with fasting goal of 90-130 mg/dl, post prandial (after you eat) less than 180.  For Hypoglycemia: BS <60 and Hyperglycemia BS >400; contact the clinic ASAP. Annual eye exams and foot exams are recommended.  Leukocytosis, unspecified type -     CBC with Differential He has been referred to Oncology/Hematology however waiting on insurance [facility] approval)   Dyslipidemia, goal LDL below 70 -     atorvastatin (LIPITOR) 20 MG tablet; Take 1 tablet (20 mg total) by mouth daily at 6 PM. INSTRUCTIONS: Work on a low fat, heart healthy diet and participate in regular aerobic exercise program by working out at least 150 minutes per week; 5 days a week-30 minutes per day. Avoid red meat/beef/steak,  fried foods. junk foods, sodas, sugary drinks, unhealthy snacking, alcohol and smoking.  Drink at least 80 oz of water per day and monitor your carbohydrate intake daily.     Patient has been counseled on age-appropriate routine health concerns for screening and prevention. These are reviewed and up-to-date. Referrals have been placed accordingly. Immunizations are up-to-date or declined.    Subjective:   Chief Complaint  Patient presents with  . Follow-up    Pt. is here for 3 months follow up on diabetes. Pt. have not use been using insulin, he  is only taking Metformin. P.t stated he don't know how to give himself insulin.    HPI [name] 34 y.o. male presents to office today for follow up   DM TYPE 2 A1c looks great!!! Down from 13 to 6.9!!!! He states "I have been doing everything that you told me to". He never started the insulin as he states he did not know how to use it. Will keep him on metformin at this time. Unfortunately he states it has been causing moderate loose stools. Will trial him on 500 mg BID instead of 1000 mg BID. He is to notify me via mychart for fasting readings >130 consistently or postprandial readings >190. Denies chest pain, shortness of breath, palpitations, lightheadedness, dizziness, headaches or BLE edema.  Lab Results  Component Value Date   HGBA1C 6.9 (A) 11/26/2019        Dyslipidemia LDL not at goal of < 70. Endorses medication compliance taking atorvastatin 20 mg daily. Denies statin intolerance or myalgias.  Lab Results  Component Value Date   LDLCALC 106 (H) 08/25/2019                      Review of Systems  Constitutional: Negative for fever, malaise/fatigue and weight loss.  HENT: Negative.  Negative for nosebleeds.   Eyes: Negative.  Negative for blurred vision, double vision and photophobia.  Respiratory: Negative.  Negative for  cough and shortness of breath.   Cardiovascular: Negative.  Negative for chest pain, palpitations and leg swelling.  Gastrointestinal: Negative.  Negative for heartburn, nausea and vomiting.  Musculoskeletal: Negative.  Negative for myalgias.  Neurological: Negative.  Negative for dizziness, focal weakness, seizures and headaches.  Psychiatric/Behavioral: Negative.  Negative for suicidal ideas.    Past Medical History:  Diagnosis Date  . Diabetes mellitus without complication [facility])     Past Surgical History:  Procedure Laterality Date  . CHOLECYSTECTOMY     planned for today  09/18/2011  . CHOLECYSTECTOMY  09/18/2011   Procedure: LAPAROSCOPIC  CHOLECYSTECTOMY WITH INTRAOPERATIVE CHOLANGIOGRAM;  Surgeon: Shann Medal, MD;  Location: [place];  Service: General;  Laterality: N/A;  . INGUINAL HERNIA REPAIR  age 89    Family History  Problem Relation Age of Onset  . Diabetes Brother   . Diabetes Other     Social History Reviewed with no changes to be made today.   Outpatient Medications Prior to Visit  Medication Sig Dispense Refill  . acetaminophen (TYLENOL) 325 MG tablet Take 650 mg by mouth every 6 (six) hours as needed for mild pain.    . Blood Glucose Monitoring Suppl (TRUE METRIX METER) w/Device KIT Use as instructed. Check blood glucose level by fingerstick TWICE per day. 1 kit 0  . glucose blood (TRUE METRIX BLOOD GLUCOSE TEST) test strip Use as instructed. Check blood glucose level by fingerstick TWICE per day. 100 each 12  . ibuprofen (ADVIL,MOTRIN) 200 MG tablet Take 200 mg by mouth every 6 (six) hours as needed for mild pain.    . Insulin Pen Needle (B-D UF III MINI PEN NEEDLES) 31G X 5 MM MISC Use as instructed. Inject into the skin once nightly. 100 each 6  . TRUEplus Lancets 28G MISC Use as instructed. Check blood glucose level by fingerstick TWICE per day. 100 each 3  . atorvastatin (LIPITOR) 20 MG tablet Take 1 tablet (20 mg total) by mouth daily at 6 PM. (Patient not taking: Reported on 11/26/2019) 90 tablet 3  . Insulin Glargine (BASAGLAR KWIKPEN) 100 UNIT/ML SOPN Inject 0.3 mLs (30 Units total) into the skin at bedtime. 30 mL 1  . losartan (COZAAR) 25 MG tablet Take 0.5 tablets (12.5 mg total) by mouth daily. 45 tablet 2  . metFORMIN (GLUCOPHAGE) 500 MG tablet Take 2 tablets (1,000 mg total) by mouth 2 (two) times daily with a meal. 360 tablet 1   No facility-administered medications prior to visit.    No Known Allergies     Objective:    BP 126/89 (BP Location: Right Arm, Patient Position: Sitting, Cuff Size: Large)   Pulse (!) 108   Temp 98.8 F (37.1 C) (Oral)   Ht 5' 9" (1.753 m)   Wt (!) 314 lb (142.4  kg)   SpO2 95%   BMI 46.37 kg/m  Wt Readings from Last 3 Encounters:  11/26/19 (!) 314 lb (142.4 kg)  08/25/19 (!) 317 lb 9.6 oz (144.1 kg)  07/19/15 (!) 337 lb (152.9 kg)    Physical Exam Vitals and nursing note reviewed.  Constitutional:      Appearance: He is well-developed.  HENT:     Head: Normocephalic and atraumatic.  Cardiovascular:     Rate and Rhythm: Normal rate and regular rhythm.     Heart sounds: Normal heart sounds. No murmur. No friction rub. No gallop.   Pulmonary:     Effort: Pulmonary effort is normal. No tachypnea or respiratory distress.  Breath sounds: Normal breath sounds. No decreased breath sounds, wheezing, rhonchi or rales.  Chest:     Chest wall: No tenderness.  Abdominal:     General: Bowel sounds are normal.     Palpations: Abdomen is soft.  Musculoskeletal:        General: Normal range of motion.     Cervical back: Normal range of motion.  Skin:    General: Skin is warm and dry.  Neurological:     Mental Status: He is alert and oriented to person, place, and time.     Coordination: Coordination normal.  Psychiatric:        Behavior: Behavior normal. Behavior is cooperative.        Thought Content: Thought content normal.        Judgment: Judgment normal.          Patient has been counseled extensively about nutrition and exercise as well as the importance of adherence with medications and regular follow-up. The patient was given clear instructions to go to ER or return to medical center if symptoms don't improve, worsen or new problems develop. The patient verbalized understanding.   Follow-up: Return in about 3 months (around 02/23/2020) for DM.   Gildardo Pounds, FNP-BC [facility] and [place], [place]   11/26/2019, 2:37 PM

## 2019-11-27 ENCOUNTER — Ambulatory Visit: Payer: Self-pay | Admitting: Nurse Practitioner

## 2019-11-27 LAB — CBC WITH DIFFERENTIAL/PLATELET
Basophils Absolute: 0.1 10*3/uL (ref 0.0–0.2)
Basos: 1 %
EOS (ABSOLUTE): 0.3 10*3/uL (ref 0.0–0.4)
Eos: 2 %
Hematocrit: 45.4 % (ref 37.5–51.0)
Hemoglobin: 15.1 g/dL (ref 13.0–17.7)
Immature Grans (Abs): 0.1 10*3/uL (ref 0.0–0.1)
Immature Granulocytes: 1 %
Lymphocytes Absolute: 2.5 10*3/uL (ref 0.7–3.1)
Lymphs: 15 %
MCH: 26.1 pg — ABNORMAL LOW (ref 26.6–33.0)
MCHC: 33.3 g/dL (ref 31.5–35.7)
MCV: 78 fL — ABNORMAL LOW (ref 79–97)
Monocytes Absolute: 0.7 10*3/uL (ref 0.1–0.9)
Monocytes: 4 %
Neutrophils Absolute: 12.6 10*3/uL — ABNORMAL HIGH (ref 1.4–7.0)
Neutrophils: 77 %
Platelets: 382 10*3/uL (ref 150–450)
RBC: 5.79 x10E6/uL (ref 4.14–5.80)
RDW: 15.2 % (ref 11.6–15.4)
WBC: 16.3 10*3/uL — ABNORMAL HIGH (ref 3.4–10.8)

## 2019-11-27 LAB — BASIC METABOLIC PANEL
BUN/Creatinine Ratio: 13 (ref 9–20)
BUN: 12 mg/dL (ref 6–20)
CO2: 25 mmol/L (ref 20–29)
Calcium: 9.7 mg/dL (ref 8.7–10.2)
Chloride: 101 mmol/L (ref 96–106)
Creatinine, Ser: 0.95 mg/dL (ref 0.76–1.27)
GFR calc Af Amer: 123 mL/min/{1.73_m2} (ref >59)
GFR calc non Af Amer: 106 mL/min/{1.73_m2} (ref >59)
Glucose: 115 mg/dL — ABNORMAL HIGH (ref 65–99)
Potassium: 4.2 mmol/L (ref 3.5–5.2)
Sodium: 141 mmol/L (ref 134–144)

## 2019-12-04 ENCOUNTER — Encounter: Payer: Self-pay | Admitting: Nurse Practitioner

## 2019-12-06 ENCOUNTER — Other Ambulatory Visit: Payer: Self-pay | Admitting: Nurse Practitioner

## 2019-12-06 ENCOUNTER — Encounter: Payer: Self-pay | Admitting: Nurse Practitioner

## 2019-12-06 MED ORDER — LISINOPRIL 5 MG PO TABS: 5.0000 mg | ORAL_TABLET | Freq: Every day | ORAL | 3 refills | Status: DC

## 2019-12-07 MED FILL — LISINOPRIL 5 MG TABLET: 5 | 30 days supply | Qty: 30 | Fill #0

## 2019-12-21 MED FILL — TRUE METRIX TEST STRIP: 50 days supply | Qty: 100 | Fill #1

## 2020-01-01 ENCOUNTER — Other Ambulatory Visit: Payer: Self-pay | Admitting: Nurse Practitioner

## 2020-01-01 DIAGNOSIS — E1165 Type 2 diabetes mellitus with hyperglycemia: Secondary | ICD-10-CM

## 2020-01-03 ENCOUNTER — Other Ambulatory Visit: Payer: Self-pay | Admitting: Nurse Practitioner

## 2020-01-03 DIAGNOSIS — E1165 Type 2 diabetes mellitus with hyperglycemia: Secondary | ICD-10-CM

## 2020-01-04 MED ORDER — METFORMIN HCL 500 MG PO TABS: 500.0000 mg | ORAL_TABLET | Freq: Two times a day (BID) | ORAL | 3 refills | Status: DC

## 2020-01-04 MED FILL — metFORMIN HCL 500 MG TABS: 500 | 30 days supply | Qty: 60 | Fill #0

## 2020-01-11 MED FILL — LISINOPRIL 5 MG TABLET: 5 | 30 days supply | Qty: 30 | Fill #1

## 2020-02-23 ENCOUNTER — Ambulatory Visit: Payer: Self-pay | Attending: Nurse Practitioner | Admitting: Nurse Practitioner

## 2020-02-23 ENCOUNTER — Other Ambulatory Visit: Payer: Self-pay

## 2020-02-26 MED FILL — METFORMIN HCL 500 MG TABS: 500 | 30 days supply | Qty: 60 | Fill #1

## 2020-03-16 MED FILL — LISINOPRIL 5 MG TABLET: 5 | 30 days supply | Qty: 30 | Fill #2

## 2020-04-11 MED FILL — METFORMIN HCL 500 MG TABS: 500 | 30 days supply | Qty: 60 | Fill #2

## 2020-04-26 MED FILL — LISINOPRIL 5 MG TABLET: 5 | 30 days supply | Qty: 30 | Fill #3

## 2020-05-16 MED FILL — METFORMIN HCL 500 MG TABS: 500 | 30 days supply | Qty: 60 | Fill #3

## 2020-05-23 MED FILL — LISINOPRIL 5 MG TABLET: 5 | 30 days supply | Qty: 30 | Fill #4

## 2020-05-23 MED FILL — TRUE METRIX TEST STRIP: 50 days supply | Qty: 100 | Fill #2

## 2020-06-03 ENCOUNTER — Other Ambulatory Visit: Payer: Self-pay

## 2020-06-16 ENCOUNTER — Encounter: Payer: Self-pay | Admitting: Nurse Practitioner

## 2020-06-16 ENCOUNTER — Other Ambulatory Visit: Payer: Self-pay | Admitting: Nurse Practitioner

## 2020-06-16 DIAGNOSIS — G4730 Sleep apnea, unspecified: Secondary | ICD-10-CM

## 2020-06-21 MED FILL — LISINOPRIL 5 MG TABLET: 5 | 30 days supply | Qty: 30 | Fill #5

## 2020-06-21 MED FILL — METFORMIN HCL 500 MG TABS: 500 | 30 days supply | Qty: 60 | Fill #0

## 2020-06-21 NOTE — Telephone Encounter (Signed)
Spoke to patient and scheduled a nurse visit for EKG.

## 2020-06-29 ENCOUNTER — Ambulatory Visit: Payer: Self-pay

## 2020-07-01 MED FILL — LISINOPRIL 5 MG TABLET: 5 | 30 days supply | Qty: 30 | Fill #5

## 2020-07-01 MED FILL — METFORMIN HCL 500 MG TABS: 500 | 30 days supply | Qty: 60 | Fill #0

## 2020-07-06 ENCOUNTER — Ambulatory Visit: Payer: Self-pay

## 2020-08-12 MED FILL — LISINOPRIL 5 MG TABLET: 5 | 30 days supply | Qty: 30 | Fill #6

## 2020-08-15 ENCOUNTER — Encounter (HOSPITAL_BASED_OUTPATIENT_CLINIC_OR_DEPARTMENT_OTHER): Payer: Self-pay | Admitting: Internal Medicine

## 2020-08-18 ENCOUNTER — Other Ambulatory Visit: Payer: Self-pay

## 2020-08-19 ENCOUNTER — Ambulatory Visit: Payer: Self-pay | Admitting: Family Medicine

## 2020-08-29 MED FILL — METFORMIN HCL 500 MG TABS: 500 | 30 days supply | Qty: 60 | Fill #1

## 2020-09-05 ENCOUNTER — Ambulatory Visit: Payer: Self-pay | Admitting: Family Medicine

## 2020-09-09 ENCOUNTER — Encounter: Payer: Self-pay | Admitting: Family Medicine

## 2020-09-09 ENCOUNTER — Telehealth: Payer: Self-pay | Admitting: Family Medicine

## 2020-09-09 NOTE — Telephone Encounter (Signed)
Pt was no show for appt 08/19/2020 for new pt visit. 1st occurrence. Pt rescheduled for 11/15 but had to be rescheduled due to provider being out. Fee waived. Letter mailed.

## 2020-09-28 MED FILL — LISINOPRIL 5 MG TABLET: 5 | 30 days supply | Qty: 30 | Fill #7

## 2020-09-29 ENCOUNTER — Other Ambulatory Visit: Payer: PRIVATE HEALTH INSURANCE

## 2020-10-18 MED FILL — METFORMIN HCL 500 MG TABS: 500 | 30 days supply | Qty: 60 | Fill #2 | Status: TO

## 2020-11-04 ENCOUNTER — Encounter (HOSPITAL_COMMUNITY): Payer: Self-pay

## 2020-11-04 ENCOUNTER — Emergency Department (HOSPITAL_COMMUNITY)
Admission: EM | Admit: 2020-11-04 | Discharge: 2020-11-04 | Disposition: A | Discharge status: Home / Self Care | Payer: 59 | Attending: Emergency Medicine | Admitting: Emergency Medicine

## 2020-11-04 ENCOUNTER — Emergency Department (HOSPITAL_COMMUNITY): Payer: 59

## 2020-11-04 ENCOUNTER — Other Ambulatory Visit: Payer: Self-pay

## 2020-11-04 DIAGNOSIS — E119 Type 2 diabetes mellitus without complications: Secondary | ICD-10-CM | POA: Insufficient documentation

## 2020-11-04 DIAGNOSIS — Z7984 Long term (current) use of oral hypoglycemic drugs: Secondary | ICD-10-CM | POA: Diagnosis not present

## 2020-11-04 DIAGNOSIS — M79605 Pain in left leg: Secondary | ICD-10-CM | POA: Insufficient documentation

## 2020-11-04 DIAGNOSIS — Y92410 Unspecified street and highway as the place of occurrence of the external cause: Secondary | ICD-10-CM | POA: Diagnosis not present

## 2020-11-04 DIAGNOSIS — M542 Cervicalgia: Secondary | ICD-10-CM | POA: Insufficient documentation

## 2020-11-04 MED ORDER — OXYCODONE-ACETAMINOPHEN 5-325 MG PO TABS
1.0000 | ORAL_TABLET | Freq: Once | ORAL | Status: AC
  Administered 2020-11-04: 1 via ORAL
  Filled 2020-11-04: qty 1

## 2020-11-04 MED ORDER — ONDANSETRON HCL 4 MG PO TABS
4.0000 mg | ORAL_TABLET | Freq: Once | ORAL | Status: AC
  Administered 2020-11-04: 4 mg via ORAL
  Filled 2020-11-04: qty 1

## 2020-11-04 MED ORDER — IBUPROFEN 600 MG PO TABS: 600.0000 mg | ORAL_TABLET | Freq: Four times a day (QID) | ORAL | 0 refills | Status: AC | PRN

## 2020-11-04 MED ORDER — CYCLOBENZAPRINE HCL 10 MG PO TABS: 10.0000 mg | ORAL_TABLET | Freq: Two times a day (BID) | ORAL | 0 refills | Status: AC | PRN

## 2020-11-04 MED ORDER — LIDOCAINE 5 % EX PTCH
1.0000 | MEDICATED_PATCH | Freq: Once | CUTANEOUS | Status: DC
  Administered 2020-11-04: 1 via TRANSDERMAL
  Filled 2020-11-04: qty 1
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
## 2020-11-04 NOTE — ED Notes (Signed)
Patient verbalizes understanding of discharge instructions. Opportunity for questioning and answers were provided. Armband removed by staff, pt discharged from ED and ambulated to lobby to return home with family.  
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
## 2020-11-08 ENCOUNTER — Inpatient Hospital Stay: Payer: 59 | Admitting: Nurse Practitioner

## 2020-11-08 ENCOUNTER — Ambulatory Visit: Payer: 59 | Attending: Nurse Practitioner | Admitting: Nurse Practitioner

## 2020-11-08 ENCOUNTER — Other Ambulatory Visit: Payer: Self-pay

## 2020-11-08 ENCOUNTER — Encounter: Payer: Self-pay | Admitting: Nurse Practitioner

## 2020-11-08 DIAGNOSIS — M542 Cervicalgia: Secondary | ICD-10-CM | POA: Diagnosis not present

## 2020-11-08 MED ORDER — METHOCARBAMOL 500 MG PO TABS: 500.0000 mg | ORAL_TABLET | Freq: Three times a day (TID) | ORAL | 0 refills | Status: AC | PRN
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
## 2020-11-10 ENCOUNTER — Encounter: Payer: Self-pay | Admitting: Nurse Practitioner

## 2020-11-14 ENCOUNTER — Other Ambulatory Visit: Payer: 59

## 2020-11-14 ENCOUNTER — Other Ambulatory Visit: Payer: Self-pay | Admitting: Nurse Practitioner

## 2020-11-14 DIAGNOSIS — E785 Hyperlipidemia, unspecified: Secondary | ICD-10-CM

## 2020-11-14 DIAGNOSIS — Z130 Encounter for screening for diseases of the blood and blood-forming organs and certain disorders involving the immune mechanism: Secondary | ICD-10-CM

## 2020-11-14 DIAGNOSIS — Z1159 Encounter for screening for other viral diseases: Secondary | ICD-10-CM

## 2020-11-14 DIAGNOSIS — E1165 Type 2 diabetes mellitus with hyperglycemia: Secondary | ICD-10-CM

## 2020-11-15 ENCOUNTER — Other Ambulatory Visit: Payer: Self-pay

## 2020-11-15 ENCOUNTER — Ambulatory Visit: Payer: 59 | Attending: Nurse Practitioner

## 2020-11-15 DIAGNOSIS — E785 Hyperlipidemia, unspecified: Secondary | ICD-10-CM

## 2020-11-15 DIAGNOSIS — Z1159 Encounter for screening for other viral diseases: Secondary | ICD-10-CM

## 2020-11-15 DIAGNOSIS — Z130 Encounter for screening for diseases of the blood and blood-forming organs and certain disorders involving the immune mechanism: Secondary | ICD-10-CM

## 2020-11-15 DIAGNOSIS — E1165 Type 2 diabetes mellitus with hyperglycemia: Secondary | ICD-10-CM

## 2020-11-16 LAB — CMP14+EGFR
ALT: 23 IU/L (ref 0–44)
AST: 16 IU/L (ref 0–40)
Albumin/Globulin Ratio: 1.5 (ref 1.2–2.2)
Albumin: 4.4 g/dL (ref 4.0–5.0)
Alkaline Phosphatase: 71 IU/L (ref 44–121)
BUN/Creatinine Ratio: 13 (ref 9–20)
BUN: 12 mg/dL (ref 6–20)
Bilirubin Total: 0.2 mg/dL (ref 0.0–1.2)
CO2: 24 mmol/L (ref 20–29)
Calcium: 9.5 mg/dL (ref 8.7–10.2)
Chloride: 105 mmol/L (ref 96–106)
Creatinine, Ser: 0.92 mg/dL (ref 0.76–1.27)
GFR calc Af Amer: 127 mL/min/{1.73_m2} (ref >59)
GFR calc non Af Amer: 110 mL/min/{1.73_m2} (ref >59)
Globulin, Total: 2.9 g/dL (ref 1.5–4.5)
Glucose: 115 mg/dL — ABNORMAL HIGH (ref 65–99)
Potassium: 4.5 mmol/L (ref 3.5–5.2)
Sodium: 142 mmol/L (ref 134–144)
Total Protein: 7.3 g/dL (ref 6.0–8.5)

## 2020-11-16 LAB — CBC
Hematocrit: 45.2 % (ref 37.5–51.0)
Hemoglobin: 15.2 g/dL (ref 13.0–17.7)
MCH: 26.9 pg (ref 26.6–33.0)
MCHC: 33.6 g/dL (ref 31.5–35.7)
MCV: 80 fL (ref 79–97)
Platelets: 289 10*3/uL (ref 150–450)
RBC: 5.65 x10E6/uL (ref 4.14–5.80)
RDW: 14.5 % (ref 11.6–15.4)
WBC: 12.8 10*3/uL — ABNORMAL HIGH (ref 3.4–10.8)

## 2020-11-16 LAB — LIPID PANEL
Chol/HDL Ratio: 4.1 ratio (ref 0.0–5.0)
Cholesterol, Total: 179 mg/dL (ref 100–199)
HDL: 44 mg/dL (ref >39)
LDL Chol Calc (NIH): 114 mg/dL — ABNORMAL HIGH (ref 0–99)
Triglycerides: 117 mg/dL (ref 0–149)
VLDL Cholesterol Cal: 21 mg/dL (ref 5–40)

## 2020-11-16 LAB — HEMOGLOBIN A1C
Est. average glucose Bld gHb Est-mCnc: 140 mg/dL
Hgb A1c MFr Bld: 6.5 % — ABNORMAL HIGH (ref 4.8–5.6)

## 2020-11-16 LAB — HCV AB W REFLEX TO QUANT PCR: HCV Ab: <0.1 s/co ratio (ref 0.0–0.9)

## 2020-11-16 LAB — HCV INTERPRETATION

## 2020-11-22 ENCOUNTER — Other Ambulatory Visit: Payer: Self-pay | Admitting: Nurse Practitioner

## 2020-11-22 DIAGNOSIS — D72829 Elevated white blood cell count, unspecified: Secondary | ICD-10-CM

## 2020-11-29 ENCOUNTER — Telehealth: Payer: Self-pay | Admitting: Hematology
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
## 2020-12-08 ENCOUNTER — Inpatient Hospital Stay: Payer: 59

## 2020-12-08 ENCOUNTER — Other Ambulatory Visit: Payer: Self-pay

## 2020-12-08 ENCOUNTER — Other Ambulatory Visit: Payer: 59

## 2020-12-08 ENCOUNTER — Telehealth: Payer: Self-pay | Admitting: Hematology

## 2020-12-08 ENCOUNTER — Inpatient Hospital Stay: Payer: 59 | Attending: Hematology and Oncology | Admitting: Hematology

## 2020-12-08 VITALS — BP 127/85 | HR 86 | Temp 98.4°F | Resp 16 | Ht 68.0 in | Wt 320.6 lb

## 2020-12-08 DIAGNOSIS — Z9049 Acquired absence of other specified parts of digestive tract: Secondary | ICD-10-CM | POA: Diagnosis not present

## 2020-12-08 DIAGNOSIS — Z7984 Long term (current) use of oral hypoglycemic drugs: Secondary | ICD-10-CM | POA: Insufficient documentation

## 2020-12-08 DIAGNOSIS — Z833 Family history of diabetes mellitus: Secondary | ICD-10-CM | POA: Diagnosis not present

## 2020-12-08 DIAGNOSIS — D72829 Elevated white blood cell count, unspecified: Secondary | ICD-10-CM | POA: Insufficient documentation

## 2020-12-08 DIAGNOSIS — L409 Psoriasis, unspecified: Secondary | ICD-10-CM | POA: Diagnosis not present

## 2020-12-08 DIAGNOSIS — E119 Type 2 diabetes mellitus without complications: Secondary | ICD-10-CM | POA: Diagnosis not present

## 2020-12-08 DIAGNOSIS — Z79899 Other long term (current) drug therapy: Secondary | ICD-10-CM | POA: Diagnosis not present

## 2020-12-08 LAB — CMP (CANCER CENTER ONLY)
ALT: 27 U/L (ref 0–44)
AST: 20 U/L (ref 15–41)
Albumin: 3.7 g/dL (ref 3.5–5.0)
Alkaline Phosphatase: 60 U/L (ref 38–126)
Anion gap: 5 (ref 5–15)
BUN: 11 mg/dL (ref 6–20)
CO2: 26 mmol/L (ref 22–32)
Calcium: 8.8 mg/dL — ABNORMAL LOW (ref 8.9–10.3)
Chloride: 106 mmol/L (ref 98–111)
Creatinine: 0.94 mg/dL (ref 0.61–1.24)
GFR, Estimated: >60 mL/min (ref >60)
Glucose, Bld: 112 mg/dL — ABNORMAL HIGH (ref 70–99)
Potassium: 4.1 mmol/L (ref 3.5–5.1)
Sodium: 137 mmol/L (ref 135–145)
Total Bilirubin: 0.3 mg/dL (ref 0.3–1.2)
Total Protein: 7.8 g/dL (ref 6.5–8.1)

## 2020-12-08 LAB — CBC WITH DIFFERENTIAL/PLATELET
Abs Immature Granulocytes: 0.02 10*3/uL (ref 0.00–0.07)
Basophils Absolute: 0.1 10*3/uL (ref 0.0–0.1)
Basophils Relative: 1 %
Eosinophils Absolute: 0.2 10*3/uL (ref 0.0–0.5)
Eosinophils Relative: 2 %
HCT: 45.2 % (ref 39.0–52.0)
Hemoglobin: 14.9 g/dL (ref 13.0–17.0)
Immature Granulocytes: 0 %
Lymphocytes Relative: 20 %
Lymphs Abs: 2 10*3/uL (ref 0.7–4.0)
MCH: 26.6 pg (ref 26.0–34.0)
MCHC: 33 g/dL (ref 30.0–36.0)
MCV: 80.7 fL (ref 80.0–100.0)
Monocytes Absolute: 0.7 10*3/uL (ref 0.1–1.0)
Monocytes Relative: 7 %
Neutro Abs: 7.1 10*3/uL (ref 1.7–7.7)
Neutrophils Relative %: 70 %
Platelets: 317 10*3/uL (ref 150–400)
RBC: 5.6 MIL/uL (ref 4.22–5.81)
RDW: 14 % (ref 11.5–15.5)
WBC: 10.1 10*3/uL (ref 4.0–10.5)
nRBC: 0 % (ref 0.0–0.2)

## 2020-12-08 LAB — SEDIMENTATION RATE: Sed Rate: 16 mm/hr (ref 0–16)

## 2020-12-08 NOTE — Patient Instructions (Signed)
Thank you for choosing [facility] to provide your oncology and hematology care.   Should you have questions after your visit to the [facility] ([facility]), please contact this office at [phone] between 8:30 AM and 4:30 PM.  Voice mails left after 4:00 PM may not be returned until the following business day.  Calls received after 4:30 PM will be answered by an off-site Nurse Triage Line.    Prescription Refills:  Please have your pharmacy contact us directly for most prescription requests.  Contact the office directly for refills of narcotics (pain medications). Allow 48-72 hours for refills.  Appointments: Please contact the [facility] scheduling department [phone] for questions regarding [facility] appointment scheduling.  Contact the schedulers with any scheduling changes so that your appointment can be rescheduled in a timely manner.   Central Scheduling for [place] [phone] - Call to schedule procedures such as PET scans, CT scans, MRI, Ultrasound, etc.  To afford each patient quality time with our providers, please arrive 30 minutes before your scheduled appointment time.  If you arrive late for your appointment, you may be asked to reschedule.  We strive to give you quality time with our providers, and arriving late affects you and other patients whose appointments are after yours. If you are a no show for multiple scheduled visits, you may be dismissed from the clinic at the providers discretion.     Resources: [facility] Social Workers [phone] for additional information on assistance programs or assistance connecting with community support programs   [place] DSS  336-641-3447: Information regarding food stamps, Medicaid, and utility assistance [place] [employer] [place] [phone]   [employer] [employer]'s shared-ride transportation service for eligible riders who have a disability that prevents them from riding the fixed route bus.   Medicare  Rights Center 800-333-4114 Helps people with Medicare understand their rights and benefits, navigate the Medicare system, and secure the quality healthcare they deserve [employer] 800-227-2345 Assists patients locate various types of support and financial assistance Cancer Care: 1-800-813-HOPE (4673) Provides financial assistance, online support groups, medication/co-pay assistance.   Transportation Assistance for appointments at [facility]: Transportation Coordinator [phone]  Again, thank you for choosing  Cancer Center for your care.       

## 2020-12-08 NOTE — Telephone Encounter (Signed)
Scheduled per 02/17 los, patient will be notified per mychart.

## 2020-12-14 ENCOUNTER — Other Ambulatory Visit: Payer: Self-pay | Admitting: Nurse Practitioner

## 2020-12-15 ENCOUNTER — Telehealth: Payer: Self-pay | Admitting: Nurse Practitioner

## 2020-12-15 LAB — BCR ABL1 FISH (GENPATH)
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
## 2020-12-19 NOTE — Telephone Encounter (Addendum)
[name] with [employer] will refax the medical release form and [name] was given billing phone number and also transferred to billing dept

## 2020-12-20 NOTE — Telephone Encounter (Signed)
Spoke with Irving Burton from [employer]. She is needing records from 11/26/19 not [id number]. Explained to her she would need to resend a new request because original was only for DOS 11-04-20 to Present and we can not release records from 11/26/19 without a new request.

## 2020-12-20 NOTE — Telephone Encounter (Signed)
Caller name: Irving Burton Relation to [id number] Call back number: phone [phone] fax # [phone]    Reason for call:  Checking on status of medical release request. Requesting date of service 11/25/2020. The caller would like a follow up once faxed.

## 2020-12-20 NOTE — Telephone Encounter (Signed)
[name] returned a call to the office regarding the DOS 11/25/2020, she says they also need all documents from this date as well.

## 2020-12-21 ENCOUNTER — Other Ambulatory Visit: Payer: Self-pay | Admitting: Nurse Practitioner
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
## 2020-12-22 ENCOUNTER — Other Ambulatory Visit: Payer: Self-pay | Admitting: Nurse Practitioner

## 2020-12-22 ENCOUNTER — Inpatient Hospital Stay: Payer: 59 | Attending: Hematology | Admitting: Hematology

## 2020-12-22 ENCOUNTER — Other Ambulatory Visit: Payer: Self-pay

## 2020-12-22 DIAGNOSIS — D72829 Elevated white blood cell count, unspecified: Secondary | ICD-10-CM | POA: Insufficient documentation

## 2020-12-22 DIAGNOSIS — Z833 Family history of diabetes mellitus: Secondary | ICD-10-CM | POA: Insufficient documentation

## 2020-12-22 DIAGNOSIS — Z79899 Other long term (current) drug therapy: Secondary | ICD-10-CM | POA: Insufficient documentation

## 2020-12-22 DIAGNOSIS — E119 Type 2 diabetes mellitus without complications: Secondary | ICD-10-CM | POA: Insufficient documentation

## 2020-12-22 DIAGNOSIS — E1165 Type 2 diabetes mellitus with hyperglycemia: Secondary | ICD-10-CM

## 2020-12-22 DIAGNOSIS — Z7984 Long term (current) use of oral hypoglycemic drugs: Secondary | ICD-10-CM | POA: Diagnosis not present

## 2020-12-23 ENCOUNTER — Ambulatory Visit: Payer: 59 | Admitting: Nurse Practitioner

## 2021-01-11 ENCOUNTER — Ambulatory Visit: Payer: 59 | Admitting: Nurse Practitioner

## 2021-06-14 ENCOUNTER — Other Ambulatory Visit: Payer: Self-pay | Admitting: Nurse Practitioner

## 2021-06-14 NOTE — Telephone Encounter (Signed)
Requested medications are due for refill today.  yes  Requested medications are on the active medications list.  yes  Last refill. 12/21/2020  Future visit scheduled.   no  Notes to clinic.  Pt has established care with Dr. Randa Evens at [facility] 04/28/2021. Pt is overdue for lab work. Pt has not been seen since 11/26/2019

## 2021-08-02 ENCOUNTER — Other Ambulatory Visit: Payer: Self-pay | Admitting: Nurse Practitioner

## 2021-08-02 DIAGNOSIS — E1165 Type 2 diabetes mellitus with hyperglycemia: Secondary | ICD-10-CM

## 2021-08-02 NOTE — Telephone Encounter (Signed)
Requested medications are due for refill today.  yes  Requested medications are on the active medications list.  yes  Last refill. 12/22/2020  Future visit scheduled.   no  Notes to clinic.  Pt last seen 11/26/2019 for this issue.
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
## 2022-10-17 ENCOUNTER — Encounter (HOSPITAL_COMMUNITY): Payer: Self-pay | Admitting: Emergency Medicine

## 2022-10-17 ENCOUNTER — Emergency Department (HOSPITAL_COMMUNITY)
Admission: EM | Admit: 2022-10-17 | Discharge: 2022-10-17 | Discharge status: Against Medical Advice | Payer: 59 | Attending: Emergency Medicine | Admitting: Emergency Medicine

## 2022-10-17 ENCOUNTER — Other Ambulatory Visit: Payer: Self-pay

## 2022-10-17 DIAGNOSIS — Z5321 Procedure and treatment not carried out due to patient leaving prior to being seen by health care provider: Secondary | ICD-10-CM | POA: Diagnosis not present

## 2022-10-17 DIAGNOSIS — R55 Syncope and collapse: Secondary | ICD-10-CM | POA: Diagnosis not present

## 2022-10-17 HISTORY — DX: Essential (primary) hypertension: I10

## 2022-10-17 LAB — RAPID URINE DRUG SCREEN, HOSP PERFORMED
Amphetamines: POSITIVE — AB
Barbiturates: NOT DETECTED
Benzodiazepines: NOT DETECTED
Cocaine: NOT DETECTED
Opiates: NOT DETECTED
Tetrahydrocannabinol: NOT DETECTED

## 2022-10-17 LAB — CBC WITH DIFFERENTIAL/PLATELET
Abs Immature Granulocytes: 0.05 10*3/uL (ref 0.00–0.07)
Basophils Absolute: 0.1 10*3/uL (ref 0.0–0.1)
Basophils Relative: 1 %
Eosinophils Absolute: 0 10*3/uL (ref 0.0–0.5)
Eosinophils Relative: 0 %
HCT: 46.2 % (ref 39.0–52.0)
Hemoglobin: 15 g/dL (ref 13.0–17.0)
Immature Granulocytes: 0 %
Lymphocytes Relative: 17 %
Lymphs Abs: 2.2 10*3/uL (ref 0.7–4.0)
MCH: 26.4 pg (ref 26.0–34.0)
MCHC: 32.5 g/dL (ref 30.0–36.0)
MCV: 81.2 fL (ref 80.0–100.0)
Monocytes Absolute: 0.5 10*3/uL (ref 0.1–1.0)
Monocytes Relative: 4 %
Neutro Abs: 9.9 10*3/uL — ABNORMAL HIGH (ref 1.7–7.7)
Neutrophils Relative %: 78 %
Platelets: 350 10*3/uL (ref 150–400)
RBC: 5.69 MIL/uL (ref 4.22–5.81)
RDW: 14.6 % (ref 11.5–15.5)
WBC: 12.8 10*3/uL — ABNORMAL HIGH (ref 4.0–10.5)
nRBC: 0 % (ref 0.0–0.2)

## 2022-10-17 LAB — SALICYLATE LEVEL: Salicylate Lvl: <7 mg/dL — ABNORMAL LOW (ref 7.0–30.0)

## 2022-10-17 LAB — ACETAMINOPHEN LEVEL: Acetaminophen (Tylenol), Serum: <10 ug/mL — ABNORMAL LOW (ref 10–30)

## 2022-10-17 LAB — COMPREHENSIVE METABOLIC PANEL
ALT: 22 U/L (ref 0–44)
AST: 24 U/L (ref 15–41)
Albumin: 3.6 g/dL (ref 3.5–5.0)
Alkaline Phosphatase: 58 U/L (ref 38–126)
Anion gap: 10 (ref 5–15)
BUN: 9 mg/dL (ref 6–20)
CO2: 23 mmol/L (ref 22–32)
Calcium: 8.9 mg/dL (ref 8.9–10.3)
Chloride: 105 mmol/L (ref 98–111)
Creatinine, Ser: 0.96 mg/dL (ref 0.61–1.24)
GFR, Estimated: >60 mL/min (ref >60)
Glucose, Bld: 151 mg/dL — ABNORMAL HIGH (ref 70–99)
Potassium: 4 mmol/L (ref 3.5–5.1)
Sodium: 138 mmol/L (ref 135–145)
Total Bilirubin: 0.4 mg/dL (ref 0.3–1.2)
Total Protein: 7.6 g/dL (ref 6.5–8.1)

## 2022-10-17 LAB — ETHANOL: Alcohol, Ethyl (B): 146 mg/dL — ABNORMAL HIGH (ref <10)

## 2022-10-17 LAB — CBG MONITORING, ED: Glucose-Capillary: 144 mg/dL — ABNORMAL HIGH (ref 70–99)
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
## 2022-10-17 NOTE — ED Notes (Addendum)
Pt not responding for vitals 2x  

## 2024-06-19 ENCOUNTER — Encounter: Payer: Self-pay | Admitting: Emergency Medicine

## 2024-06-19 ENCOUNTER — Ambulatory Visit
Admission: EM | Admit: 2024-06-19 | Discharge: 2024-06-19 | Disposition: A | Discharge status: Home / Self Care | Attending: Family Medicine | Admitting: Family Medicine

## 2024-06-19 DIAGNOSIS — H65192 Other acute nonsuppurative otitis media, left ear: Secondary | ICD-10-CM | POA: Diagnosis not present

## 2024-06-19 MED ORDER — IBUPROFEN 800 MG PO TABS: 800.0000 mg | ORAL_TABLET | Freq: Three times a day (TID) | ORAL | 0 refills | Status: AC

## 2024-06-19 MED ORDER — IBUPROFEN 800 MG PO TABS: 800.0000 mg | ORAL_TABLET | Freq: Once | ORAL | Status: DC

## 2024-06-19 MED ORDER — AMOXICILLIN-POT CLAVULANATE 875-125 MG PO TABS: 1.0000 | ORAL_TABLET | Freq: Two times a day (BID) | ORAL | 0 refills | Status: AC

## 2024-06-19 NOTE — ED Triage Notes (Signed)
 Patient c/o left ear pain and swelling since yesterday.  Denies any other sx's.  Afebrile.  Patient has taken Tylenol .

## 2024-06-19 NOTE — ED Provider Notes (Signed)
 [facility]  Note:  This document was prepared using Conservation officer, historic buildings and may include unintentional dictation errors.  MRN: [id number] DOB: [birth year]  Subjective:   [name] is a 39 y.o. male presenting for 1 day history of left ear pain, left ear swelling.  No fever, sinus symptoms, ear drainage, tinnitus.  No trauma.    No current facility-administered medications for this encounter.  Current Outpatient Medications:    acetaminophen  (TYLENOL ) 325 MG tablet, Take 650 mg by mouth every 6 (six) hours as needed for mild pain., Disp: , Rfl:    Blood Glucose Monitoring Suppl (TRUE METRIX METER) w/Device KIT, Use as instructed. Check blood glucose level by fingerstick TWICE per day., Disp: 1 kit, Rfl: 0   glucose blood (TRUE METRIX BLOOD GLUCOSE TEST) test strip, Use as instructed. Check blood glucose level by fingerstick TWICE per day., Disp: 100 each, Rfl: 12   lisinopril  (ZESTRIL ) 5 MG tablet, TAKE 1 TABLET BY MOUTH EVERY DAY, Disp: 30 tablet, Rfl: 3   metFORMIN  (GLUCOPHAGE ) 500 MG tablet, TAKE 1 TABLET BY MOUTH TWICE A DAY WITH MEALS, Disp: 180 tablet, Rfl: 1   TRUEplus Lancets 28G MISC, Use as instructed. Check blood glucose level by fingerstick TWICE per day., Disp: 100 each, Rfl: 3   atorvastatin  (LIPITOR) 20 MG tablet, Take 1 tablet (20 mg total) by mouth daily at 6 PM., Disp: 90 tablet, Rfl: 3   cyclobenzaprine  (FLEXERIL ) 10 MG tablet, Take 1 tablet (10 mg total) by mouth 2 (two) times daily as needed for muscle spasms. (Patient not taking: Reported on 11/08/2020), Disp: 10 tablet, Rfl: 0   No Known Allergies  Past Medical History:  Diagnosis Date   Diabetes mellitus without complication (HCC)    HTN (hypertension)      Past Surgical History:  Procedure Laterality Date   CHOLECYSTECTOMY     planned for today  09/18/2011   CHOLECYSTECTOMY  09/18/2011   Procedure: LAPAROSCOPIC CHOLECYSTECTOMY WITH INTRAOPERATIVE CHOLANGIOGRAM;   Surgeon: Alm VEAR Angle, MD;  Location: [facility] OR;  Service: General;  Laterality: N/A;   INGUINAL HERNIA REPAIR  age 34    Family History  Problem Relation Age of Onset   Diabetes Brother    Diabetes Other     Social History   Tobacco Use   Smoking status: Never   Smokeless tobacco: Never  Vaping Use   Vaping status: Never Used  Substance Use Topics   Alcohol use: Yes    Comment: on occasion   Drug use: No    ROS   Objective:   Vitals: BP 135/81 (BP Location: Right Arm)   Pulse (!) 107   Temp 99.7 F (37.6 C) (Oral)   Resp 18   Ht 5' 8 (1.727 m)   Wt 290 lb (131.5 kg)   SpO2 92%   BMI 44.09 kg/m   Physical Exam Constitutional:      General: He is not in acute distress.    Appearance: Normal appearance. He is well-developed and normal weight. He is not ill-appearing, toxic-appearing or diaphoretic.  HENT:     Head: Normocephalic and atraumatic.     Right Ear: Tympanic membrane, ear canal and external ear normal. No tenderness. No middle ear effusion. There is no impacted cerumen. Tympanic membrane is not bulging.     Left Ear: Ear canal and external ear normal. Tenderness present. A middle ear effusion is present. There is no impacted cerumen. Tympanic membrane is bulging.  Nose: Nose normal.     Mouth/Throat:     Pharynx: Oropharynx is clear.  Eyes:     General: No scleral icterus.       Right eye: No discharge.        Left eye: No discharge.     Extraocular Movements: Extraocular movements intact.  Cardiovascular:     Rate and Rhythm: Normal rate.  Pulmonary:     Effort: Pulmonary effort is normal.  Musculoskeletal:     Cervical back: Normal range of motion.  Neurological:     Mental Status: He is alert and oriented to person, place, and time.  Psychiatric:        Mood and Affect: Mood normal.        Behavior: Behavior normal.        Thought Content: Thought content normal.        Judgment: Judgment normal.      Assessment and Plan :   PDMP  not reviewed this encounter.  1. Other non-recurrent acute nonsuppurative otitis media of left ear    Start Augmentin  to cover for otitis media. Use supportive care otherwise. Counseled patient on potential for adverse effects with medications prescribed/recommended today, ER and return-to-clinic precautions discussed, patient verbalized understanding.    Christopher Savannah, [place] 06/19/24 1517
